# Patient Record
Sex: Male | Born: 1945 | Race: White | Hispanic: No
Health system: Midwestern US, Academic
[De-identification: ages and names within clinical notes are randomized; demographics above are authoritative.]

---

## 2004-05-31 LAB — COMPREHENSIVE METABOLIC PANEL
ALT: 42 units/L (ref 3–45)
AST: 29 units/L (ref 3–35)
Albumin: 4.4 g/dL (ref 3.7–5.2)
Alkaline Phosphatase: 57 units/L (ref 44–160)
Anion Gap: 8 (ref 3–16)
BUN: 18 mg/dL (ref 7–21)
CO2: 26 mmol/L (ref 19–32)
Calcium: 9.4 mg/dL (ref 8.6–10.4)
Chloride: 106 mmol/L (ref 100–110)
Creatinine: 1.1 mg/dL (ref 0.7–1.4)
Glucose: 98 mg/dL (ref 70–105)
Potassium: 4.8 mmol/L (ref 3.5–5.0)
Sodium: 140 mmol/L (ref 136–146)
Total Bilirubin: 0.8 mg/dL (ref 0.2–1.0)
Total Protein: 7.3 g/dL (ref 6.2–8.3)

## 2004-05-31 LAB — DIFFERENTIAL, MANUAL
Basophils Absolute: 0.1 10*3/uL (ref 0.0–0.1)
Basophils Relative: 0.7 % (ref 0.0–1.0)
Eosinophils Absolute: 0.1 10*3/uL (ref 0.0–0.6)
Lymphocytes Absolute: 2.9 10*3/uL (ref 0.6–3.2)
Lymphocytes Relative: 41.9 % (ref 15.0–45.0)
Monocytes Absolute: 0.5 10*3/uL (ref 0.0–1.0)
Monocytes Relative: 7.7 % (ref 0.0–12.0)
Neutrophils Absolute: 3.4 10*3/uL (ref 1.0–8.0)
Neutrophils Relative: 47.8 % (ref 40.0–80.0)

## 2004-05-31 LAB — CBC
Hematocrit: 47.6 % (ref 40.0–52.0)
Hemoglobin: 16.1 g/dL (ref 13.6–18.0)
MCH: 30.7 pg (ref 27.0–34.0)
Platelets: 151 10*3/uL (ref 140–400)
RBC: 5.25 10*6/uL (ref 4.30–6.00)
RDW: 12.9 % (ref 11.5–14.5)
WBC: 7 10*3/uL (ref 4.5–11.0)

## 2004-05-31 LAB — LIPID PANEL
Cholesterol, Total: 178 mg/dL (ref 0–200)
HDL: 39 mg/dL (ref 34–61)
LDL Cholesterol: 89 mg/dL (ref 0–100)
Triglycerides: 251 mg/dL (ref 0–150)

## 2004-05-31 LAB — PSA, TOTAL AND FREE: PSA: 0.38 ng/mL (ref 0.0–4.0)

## 2004-05-31 NOTE — Unmapped (Signed)
Signed by   LinkLogic on 05/31/2004 at 22:04:09  Patient: Larry Pugh  Note: All result statuses are Final unless otherwise noted.    Tests: (1) LIPID PROFILE (FATS)    Order Note: LAV1 SST1    Cholesterol               178 mg/dL                   2-202    Triglyceride         [H]  251 mg/dL                   5-427    HDL                       39 mg/dL                    06-23    LDL, calc                 89 mg/dL                    7-628    Note: An exclamation mark (!) indicates a result that was not dispersed into   the flowsheet.  Document Creation Date: 05/31/2004 10:04 PM  _______________________________________________________________________    (1) Order result status: Final  Collection or observation date-time: 05/31/2004 08:45  Requested date-time: 05/31/2004 08:45  Receipt date-time: 05/31/2004 17:18  Reported date-time: 05/31/2004 22:04  Referring Physician:    Ordering Physician: Fayrene Fearing Myasia Sinatra [facility])  Specimen Source: S&SERUM     SST REFRIG&SST (Refrig)  Source: Butler Denmark Order Number: [id number]  Lab site: [facility]  [id number]

## 2004-05-31 NOTE — Unmapped (Signed)
Signed by   LinkLogic on 05/31/2004 at 18:54:35  Patient: Larry Pugh  Note: All result statuses are Final unless otherwise noted.    Tests: (1) CBC (CBC)    Order Note: LAV1 SST1    WBC                       7.0 10*3/uL                 4.5-11.0    RBC                       5.25 10*6/uL                4.30-6.00    Hgb                       16.1 g/dL                   43.3-29.5    HCT                       47.6 %                      40.0-52.0  ! MCV                       90.7 fL                     81.0-103.0    MCH                       30.7 pg                     27.0-34.0  ! MCHC                      33.8 g/dL                   18.8-41.6    RDW                       12.9 %                      11.5-14.5    Platelet Count            151 10*3/uL                 140-400  ! MPV                  [H]  13.0 fL                     9.5-12.5    Note: An exclamation mark (!) indicates a result that was not dispersed into   the flowsheet.  Document Creation Date: 05/31/2004 6:54 PM  _______________________________________________________________________    (1) Order result status: Final  Collection or observation date-time: 05/31/2004 08:45  Requested date-time: 05/31/2004 08:45  Receipt date-time: 05/31/2004 17:18  Reported date-time: 05/31/2004 18:54  Referring Physician:    Ordering Physician: Fayrene Fearing Keshaun Dubey ([facility])  Specimen Source: WB&WHOLE BLOOD     LV5  REFRIG&LAV  (Refrig)  Source: Butler Denmark Order Number: [id number]  Lab site: [facility], [address]  [place]  [place]  [address]

## 2004-05-31 NOTE — Unmapped (Signed)
Signed by   LinkLogic on 06/02/2004 at 01:17:09  Patient: Larry Pugh  Note: All result statuses are Final unless otherwise noted.    Tests: (1) PSA (PROSTATIC SPECIFIC ANTIGEN) (PSA)    Order Note: Test(s) added on as requested.      Prostate Spec Ag          0.38 ng/mL                  0.0-4.0      If you are ordering a Screening PSA on a Medicare Patient, you must order   test code PSASCR and provide a V76.44 diagnosis code. Medicare covers a   screening PSA once every 12 months for males over 50.    Prostatic Specific Antigen (PSA) is performed by [employer] using a   chemiluminescent immunoassay.  Results obtained with different assay methods   or kits cannot be used interchangeably.  PSA measurement is indicated in   conjunction with Digital Rectal Exam (DRE) as an aid in the detection of   prostatic cancer in men aged 39 or older.  This assay is further indicated as   an aid in the management (monitoring) of patients with prostatic cancer.  PSA   can also be useful for determining possible recurrence after therapy when used   in conjunction with other diagnostic indices.    Note: An exclamation mark (!) indicates a result that was not dispersed into   the flowsheet.  Document Creation Date: 06/02/2004 1:17 AM  _______________________________________________________________________    (1) Order result status: Final  Collection or observation date-time: 05/31/2004 08:45  Requested date-time: 05/31/2004 08:45  Receipt date-time: 06/01/2004 10:15  Reported date-time: 06/02/2004 01:17  Referring Physician:    Ordering Physician: Fayrene Fearing Keighan Amezcua [facility])  Specimen Source: S&SERUM     SST REFRIG&SST (Refrig)  Source: Butler Denmark Order Number: [id number]  Lab site: [facility]  [id number]

## 2004-05-31 NOTE — Unmapped (Signed)
Signed by   LinkLogic on 05/31/2004 at 18:54:36  Patient: Larry Pugh  Note: All result statuses are Final unless otherwise noted.    Tests: (1) DIFFERENTIAL (DIFF)    Order Note: LAV1 SST1    Neutrophil                47.8 %                      40.0-80.0    Lymphocyte                41.9 %                      15.0-45.0    Monocyte                  7.7 %                       0.0-12.0    Eosinophil                1.9 %                       0.0-8.0    Basophils                 0.7 %                       0.0-1.0    ABS NEUT                  3.4 10*3/uL                 1.0-8.0    Abs LYMPH                 2.9 10*3/uL                 0.6-3.2    Abs MONO                  0.5 10*3/uL                 0.0-1.0    Abs EOS                   0.1 10*3/uL                 0.0-0.6    Abs BASO                  0.1 10*3/uL                 0.0-0.1    Note: An exclamation mark (!) indicates a result that was not dispersed into   the flowsheet.  Document Creation Date: 05/31/2004 6:54 PM  _______________________________________________________________________    (1) Order result status: Final  Collection or observation date-time: 05/31/2004 08:45  Requested date-time: 05/31/2004 08:45  Receipt date-time: 05/31/2004 17:18  Reported date-time: 05/31/2004 18:54  Referring Physician:    Ordering Physician: Fayrene Fearing Laterrian Hevener ([facility]JK)  Specimen Source: WB&WHOLE BLOOD     LV5  REFRIG&LAV  (Refrig)  Source: Butler Denmark Order Number: [id number]  Lab site: [facility]  [id number]      -----------------    The following results were not dispersed to the flowsheet  because of errors during the import process:  Eosinophil, 1.9 %, (F)

## 2004-05-31 NOTE — Unmapped (Signed)
Signed by   LinkLogic on 05/31/2004 at 22:04:08  Patient: Larry Pugh  Note: All result statuses are Final unless otherwise noted.    Tests: (1) COMPREHENSIVE METABOLIC PANEL (METAPNL)    Order Note: LAV1 SST1    Sodium                    140 mEq/L                   136-146    Potassium                 4.8 mEq/L                   3.5-5.0    Chloride                  106 mEq/L                   100-110    CO2                       26 mEq/L                    19-32    Anion Gap                 8 mEq/L                     3-16    BUN                       18 mg/dL                    0-27    Creatinine                1.1 mg/dL                   2.5-3.6    Glucose                   98 mg/dL                    64-403    Calcium                   9.4 mg/dL                   4.7-42.5    BILI, Total               0.8 mg/dL                   9.5-6.3    AST (SGOT)                29 U/L                      3-35    ALT (SGPT)                42 U/L                      3-45    Alk Phosphatase           57 U/L                      44-160  Protein, Total            7.3 g/dL                    2.5-9.5    Albumin                   4.4 g/dL                    6.3-8.7  ! GFR MDRD Af Amer          89 See Note      GFR is estimated using Creatinine, age, gender and race. Patient's values   should be interpreted as a trend.      Between 30 and 90 ml/min/1.74m2, clinical correlation is needed.     For additional information:     [email or web address] and [email or web address]    ! GFR MDRD Non Af Amer      73 See Note      GFR is estimated using Creatinine, age, gender and race. Patient's values   should be interpreted as a trend.      Between 30 and 90 ml/min/1.33m2, clinical correlation is needed.     For additional information:     [email or web address] and [email or web address]    ! 1/Creatinine              0.91    Note: An exclamation mark (!) indicates a result that was not dispersed into   the flowsheet.  Document Creation Date: 05/31/2004 10:04  PM  _______________________________________________________________________    (1) Order result status: Final  Collection or observation date-time: 05/31/2004 08:45  Requested date-time: 05/31/2004 08:45  Receipt date-time: 05/31/2004 17:18  Reported date-time: 05/31/2004 22:04  Referring Physician:    Ordering Physician: Fayrene Fearing Viriginia Amendola [facility])  Specimen Source: S&SERUM     SST REFRIG&SST (Refrig)  Source: Butler Denmark Order Number: [id number]  Lab site: [facility]  [id number]

## 2005-04-27 NOTE — Unmapped (Signed)
Signed by   LinkLogic on 04/28/2005 at 00:11:28  Patient: Larry Pugh  Note: All result statuses are Final unless otherwise noted.    Tests: (1)  (MR)    Order Note:                                        [facility]     PATIENT NAME:   Larry, Pugh                     MR #:  [id number]  DATE OF BIRTH:  Nov 22, 1945                         ACCOUNT #:  [id number]  SURGEON:        Denton Ar, M.D.          ROOM #:  SDS  SERVICE:        Orthopedic Surgery                 NURSING UNIT:  JSDS  PRIMARY:        Horton Chin, M.D.             FC:  C  REFERRING:      Orinda Kenner, M.D.             ADMIT DATE:  04/27/2005  DICTATED BY:    Denton Ar, M.D.          SURGERY DATE:  04/27/2005                                                     DISCHARGE DATE:                                    OPERATIVE REPORT     PREOPERATIVE DIAGNOSIS:     1.  Left knee anterior cruciate ligament subacute tear.     POSTOPERATIVE DIAGNOSIS(ES):     1.  Left anterior cruciate ligament tear, complete.  2.  Medial femoral condyle chondromalacia grade III, central third arch with       loose articular flaps.  3.  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     PROCEDURE(S) PERFORMED:     1.  Left knee exam.  2.  Arthroscopy.  3.  Arthroscopic anterior cruciate ligament reconstruction.  4.  Central third patellar tendon autograft.  5.  Chondroplasty and debridement.     SURGEON:  Denton Ar, MD ASS'T:  Babette Relic.     ANESTHESIA:  General.     PREOPERATIVE ANTIBIOTICS:  Ancef 1 gram.     INDICATIONS FOR PROCEDURE:  The patient is 59.  He had an injury sustaining a  fibular head small avulsion fracture combining with an ACL tear.  He had a  prior meniscectomy done about 10 years ago medially.  He had persistent  problems, feelings of instability, his fibular head fracture, which was small  and had healed well on x-ray's.  There was some concern of  posterolateral  component to this injury.  His clinical exam showed some slight asymmetry in  the posterolateral quadrant, but he did not have increased external rotation  in the office nor did he have a posterolateral significant instability or a  reversed pivot.  Because of failed treatment with the ACL he wished to  proceed surgically.  He did lack slight extension on the left side and I  reviewed with him in the office extensively about getting back to full motion  before surgery, but he was instent on pursuing with the surgery being aware  that he may loose some extension.     DETAILS OF OPERATIVE FINDINGS:  The patient's exam showed a positive pivot  shift, no reverse pivot, no real external rotation, increased at 0-90s.  Posterolateral drawer had some minimally asymmetry.  He had no effusion.  Anterior drawer 1+.     Diagnostic arthroscopy of his left knee demonstrated normal trochlea except  for minor grooves.  His patella had some grade III changes, centrally to the  lateral facet.  These required debridement.  The medial and lateral gutters  were clean.  His lateral compartment and normal articular surfaces and  meniscus did not showed his ACL had scarred into the PCL.  There was a large  remnant of the ACL flipped anteriorly.  This was all removed and cleaned out.  PCL intact.  His medial compartment and a prior meniscotomy and meniscus rim  was stable.  His medial and femoral condyle had grade III changes central  third arch, which was moved down.  These were somewhat diffuse and were  involved more laterally in the MSE.  At the completion of the surgery his ACL  was reconstructed, elimination of his Lachman and Pivot shift.  There is no  lateral wall or notch impingement from the graft.     DETAILS OF PROCEDURE:  The patient was brought to the operating room, put to  sleep, and exam was performed.  He was then positioned with standard  pneumatic tourniquet proximally, but was not used during the  surgery.  Wounds  were preinjected in the knee with 0.25% Marcaine with epinephrine.     Standard prep and drape.  Initially the graft was harvested through twin  horizontal incisions, proximal base of the inferior pole of the patella,  distal three fingerbreadths below the medial joint line.  He had a very broad  patellar tendon measuring about 36 mm.  We left 13-mm medially and took a 1  cm of graft.  We took the patella outline the bone plug of the graft, sawed,  removed and passed into the inferior incision, outlined, sawed and removed.  The graft was fashioned in the backtable to 10 and 9 for the femoral and  tibial sides respectively.  They were about 25 length plugs.     The excess bone was placed in the patella defect as a bone graft.  Oversewn  ________ with 0 Vicryl.     The knee was then arthroscoped.  Above findings were noted.  ArthroCare 2590  was used for the chondroplasty as well as for radius.  Following the  chondroplasties the stump of the ACL was removed.  The notchplasty performed  with combination of an incisor blade full radius and 4.0 round bur.  Once  this was conalized and good visualization of the posterior portion of the  lateral side of the notch obtained curette hole was used as a pilot hole in  this region followed by the Protek tibial guide.  The tibial guide was then  used to pass a pin up in front of the PCL.  The pin was then overdrilled with  a 10-mm cannulated reamer.  The debris was removed from the knee.  The Beath  pin was then passed through the tunnel and into the femoral side with the  knee in about 70 of flexion into the small curette pilot hole.  This was  passed out laterally.  The blind tunnel was drilled to 25 with a 9-mm acorn  bit.  Debris was removed.  A curette was used to notch the anterior portion  of the tunnel on the femoral side.     The graft was then pulled into the knee, nice purchase in both tunnels.  The  femoral side was then fixed with a 7 x 20 screw  with nice purchase.  The  graft was cycled through 30 cycles to remove creep from the graft and tibial  side was then fixed with a 9 x 20 screw with nice purchase.     The patient had elimination of his Lachman and Pivot.     The femoral screw was somewhat vertical and an x-ray was obtained on the  table showed nice screw placement.     Wounds were irrigated.  Closure of the deep structures with some 0 Vicryl on  the distal wound and subcutaneous with 2-0 and subcuticular 4-0 runners on  both wounds, small Steri-Strips, gauze and Tegaderm.     He was reexamined again on the posterolateral corner and he had a symmetric  exam at this point.     He was placed in Vantage brace over top of a cryocuff and brought to PACU in  stable satisfactory condition.     He will go through standard outpatient patient ACL protocol.     TOTAL TIME SPENT WITH PATIENT:                                                                   ________________________________________  RSJ/rm                                ____  D:  04/27/2005 13:48                  Denton Ar, M.D.  T:  04/28/2005 00:06  Job #:  [id number]                                       OPERATIVE REPORT                                        COPY                   PAGE    1 of 1    Note: An exclamation mark (!) indicates a result that was not dispersed into   the flowsheet.  Document Creation Date: 04/28/2005 12:11 AM  _______________________________________________________________________    (  1) Order result status: Final  Collection or observation date-time: 04/27/2005 00:00  Requested date-time:   Receipt date-time:   Reported date-time:   Referring Physician: Silvestre Moment  Ordering Physician:  [facility])  Specimen Source:   Source: DBS  Filler Order Number: [id number]  Lab site:

## 2005-09-05 NOTE — Unmapped (Signed)
Signed by Samantha Crimes MD on 09/07/2005 at 11:16:46    NEUROLOGY GENERAL VISIT    HPI General   Chief Complaint: B/L leg twitching for 5 years    History of Present Illness:   This 60 year old right-handed man with a history of diverticulosis and two back operations was seen today because of calf twitching.     The patient said the problem began around five years ago. He has noticed painless twitching in his calves bilaterally and, over the years, his calf muscles have enlarged in size. He is physically active but has not been doing weight training in his legs. He did have right knee surgery recently. He does ride a bicycle. He had two back operations in the 1980s and after those surgeries, he did have leg numbness that was fairly significant. He is not sure how far up his legs the numbness extended. At one point he had sciatic distribution discomfort on the right, but at another point had it on the left. It sounds as if his back has been reasonably stable in recent years. He did have an MRI in 2005 because of the calf twitching. He had a previous back MRI and a neck MRI about 10 years ago. He brought all three studies for review and I saw no major disturbances. I do not have any written reports on his MRIs.     He has no clear history of neuropathy. He has no history of diabetes. There is no family history of muscular dystrophy. He has a sister with diabetes who has neuropathy. He has a brother who had a stroke.      On exam today, the patient had no abnormalities of cranial nerves II-XII. The motor exam of the upper extremities showed normal tone and power. He had normal fine motor and finger-to-nose movements. His lower extremity exam showed excellent power in all groups. He did have hypertrophy of his calf muscles. He also had obvious calf myokymia bilaterally. I saw no fasciculations or abnormal muscle movements elsewhere. His tone was normal in the legs. I did not test his right knee jerk but his left knee  jerk was 2+. Both ankle jerks were intact. Both toes were downgoing. He had intact vibratory sense in his feet. Cold sense was somewhat diminished in the left foot compared to the right. Pinprick was diminished in his feet compared to his thighs. He did not perceive pinprick that well over his fingertips on either hand. His gait was unremarkable.           LEXAPRO 20 MG TABS (ESCITALOPRAM OXALATE) 1 po qd  TRAZODONE HCL 50 MG TABS (TRAZODONE HCL) 1/2 tab po qd      Intake-Neurology   Chief Complaint: B/L leg twitching for 5 years    Vital Signs   Height: 74 inches  Weight: 224 pounds  Pulse rate: 80   BP #1: 116 / 78mm Hg   BMI: 28.86  BSA: 2.28      Allergies  No Known Allergies  New Medication:  LEXAPRO 20 MG TABS (ESCITALOPRAM OXALATE) 1 po qd  TRAZODONE HCL 50 MG TABS (TRAZODONE HCL) 1/2 tab po qd    Intake recorded by: Lurlean Horns  Sep 05, 2005 9:58 AM          Assessment and Plan  New Problems:  Dx of SYMPTOM, ABNORMAL INVOLUNTARY MOVEMENT NEC (ICD-781.0)     Medications   New medications:  LEXAPRO 20 MG TABS -- 1 po qd  TRAZODONE HCL 50 MG TABS -- 1/2 tab po qd    Assessment    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 He does not have fasciculations. The myokymia that he has is often idiopathic but it can be seen after nerve injuries of various kinds or in the presence of progressive neuropathies. He has not had radiation therapy, which is one risk factor for myokymia. I suspect his myokymia is due to unusual nerve-generated electrical activity perhaps related in some way to his back surgeries in the 1980s. He could try Neurontin if he wants to diminish the severity over time, but the myokymia is not in and of itself harmful. The patient says he does not need to take any medication for it, but mainly wanted to see if it was a sign of any more serious problem. I reassured him that it was not. There is no sign here of motor neuron disease. An EMG could be done to document the  anatomic origin of these discharges, but I do not think that would result in any change in therapy.    Plan    The patient will return to see me if his problem worsens or he develops new neurologic symptoms.     Today's Orders   99244 - Compre Mod Complex - Neuro [[id number]]    Disposition/Follow Up:   Return to clinic as needed

## 2005-09-05 NOTE — Unmapped (Signed)
Signed by Samantha Crimes MD on 09/05/2005 at 00:00:00  Neurology      Imported By: Coletta Memos 09/16/2005 11:13:19    _____________________________________________________________________    External Attachment:    Please see [employer] EMR for this document.

## 2005-09-09 NOTE — Unmapped (Signed)
Signed by Lyla Son on 09/09/2005 at 16:42:32                    Sep 05, 2005          Leanora Ivanoff, M.D.  [phone] [place], [place]   [address]    RE: Larry Pugh   DOB:  [birth year]    Dear Dr. Loleta Chance:     This 60 year old right-handed man with a history of diverticulosis and two back operations was seen today because of calf twitching.     The patient said the problem began around five years ago. He has noticed painless twitching in his calves bilaterally and, over the years, his calf muscles have enlarged in size. He is physically active but has not been doing weight training in his legs. He did have right knee surgery recently. He does ride a bicycle. He had two back operations in the 1980s and after those surgeries, he did have leg numbness that was fairly significant. He is not sure how far up his legs the numbness extended. At one point he had sciatic distribution discomfort on the right, but at another point had it on the left. It sounds as if his back has been reasonably stable in recent years. He did have an MRI in 2005 because of the calf twitching. He had a previous back MRI and a neck MRI about 10 years ago. He brought all three studies for review and I saw no major disturbances. I do not have any written reports on his MRIs.     He has no clear history of neuropathy. He has no history of diabetes. There is no family history of muscular dystrophy. He has a sister with diabetes who has neuropathy. He has a brother who had a stroke.      On exam today, the patient had no abnormalities of cranial nerves II-XII. The motor exam of the upper extremities showed normal tone and power. He had normal fine motor and finger-to-nose movements. His lower extremity exam showed excellent power in all groups. He did have hypertrophy of his calf muscles. He also had obvious calf myokymia bilaterally. I saw no fasciculations or abnormal muscle movements elsewhere. His tone was normal in the legs. I did  not test his right knee jerk but his left knee jerk was 2+. Both ankle jerks were intact. Both toes were downgoing. He had intact vibratory sense in his feet. Cold sense was somewhat diminished in the left foot compared to the right. Pinprick was diminished in his feet compared to his thighs. He did not perceive pinprick that well over his fingertips on either hand. His gait was unremarkable.       Impression:  Calf myokymia. Mr. Greis exam does not show any signs of weakness. He does not have fasciculations. The myokymia that he has is often idiopathic but it can be seen after nerve injuries of various kinds or in the presence of progressive neuropathies. He has not had radiation therapy, which is one risk factor for myokymia. I suspect his myokymia is due to unusual nerve-generated electrical activity perhaps related in some way to his back surgeries in the 1980s. He could try Neurontin if he wants to diminish the severity over time, but the myokymia is not in and of itself harmful. The patient says he does not need to take any medication for it, but mainly wanted to see if it was a sign of any more  serious problem. I reassured him that it was not. There is no sign here of motor neuron disease. An EMG could be done to document the anatomic origin of these discharges, but I do not think that would result in any change in therapy.    Plan:  The patient will return to see me if his problem worsens or he develops new neurologic symptoms.     Thank you for allowing me to participate in the care of this patient.  If you would like a copy of my office note, please contact our Medical Records department at [phone].  Please feel free to contact me should you have any concerns or questions.    Sincerely,      Shellia Cleverly, MD    /md

## 2006-04-04 LAB — COMPREHENSIVE METABOLIC PANEL
A/G Ratio: 1.7 (ref 1.0–2.1)
ALT: 57 units/L (ref 9–60)
AST: 49 units/L (ref 10–35)
Albumin: 4.4 g/dL (ref 3.6–5.1)
Alkaline Phosphatase: 59 units/L (ref 40–115)
BUN/Creatinine Ratio: 18 (ref 6–22)
BUN: 21 mg/dL (ref 7–25)
CO2: 26 mmol/L (ref 21–33)
Calcium: 8.8 mg/dL (ref 8.6–10.2)
Chloride: 105 mmol/L (ref 98–110)
Creatinine: 1.2 mg/dL (ref 0.50–1.30)
GFR MDRD Non Af Amer: >60 mL/min (ref >=60)
Globulin, Total: 2.6 g/dL (ref 2.1–3.7)
Glucose: 99 mg/dL (ref 65–99)
Potassium: 4.5 mmol/L (ref 3.5–5.3)
Sodium: 140 mmol/L (ref 135–146)
Total Bilirubin: 0.6 mg/dL (ref 0.2–1.2)
Total Protein: 7 g/dL (ref 6.2–8.3)

## 2006-04-04 LAB — CBC AND DIFFERENTIAL
Basophils Absolute: 55 10*3/uL (ref 0–200)
Basophils Relative: 0.9 %
Eosinophils Absolute: 128 10*3/uL (ref 15–500)
Eosinophils Relative: 2.1 %
Hematocrit: 44.6 % (ref 38.5–50.0)
Hemoglobin: 15.3 g/dL (ref 13.2–17.1)
Lymphocytes Absolute: 2464 10*3/uL (ref 850–3900)
Lymphocytes Relative: 40.4 %
MCH: 31.5 pg (ref 27.0–33.0)
MCHC: 34.3 g/dL (ref 32.0–36.0)
MCV: 91.7 fL (ref 80.0–100.0)
Monocytes Absolute: 671 10*3/uL (ref 200–950)
Monocytes Relative: 11 %
Neutrophils Absolute: 2782 10*3/uL (ref 1500–7800)
Neutrophils Relative: 45.6 %
Platelets: 165 10*3/uL (ref 140–400)
RBC: 4.86 10*6/uL (ref 4.20–5.80)
RDW: 13.8 % (ref 11.0–15.0)
WBC: 6.1 10*3/uL (ref 3.8–10.8)

## 2006-04-04 LAB — PSA, TOTAL AND FREE: PSA: 0.2 ng/mL (ref <=4.0)

## 2006-04-04 LAB — LIPID PANEL
Chol/HDL Ratio: 3.7 (ref <=5.0)
Cholesterol, Total: 155 mg/dL (ref 125–200)
HDL: 42 mg/dL (ref >=40)
LDL Cholesterol: 81 mg/dL (ref <130)
Triglycerides: 158 mg/dL (ref <150)

## 2006-04-04 LAB — IRON STUDIES
% Iron Saturation: 24 % (ref 20–50)
Iron: 79 ug/dL (ref 45–170)
TIBC: 329 ug/dL (ref 250–425)

## 2006-04-04 NOTE — Unmapped (Signed)
Signed by   LinkLogic on 04/05/2006 at 07:14:38  Patient: Larry Pugh  Note: All result statuses are Final unless otherwise noted.    Tests: (1) PSA, TOTAL (VWU-9811)    PSA, TOTAL                0.2 ng/mL                   < OR = 4.0      PSA VALUES FROM DIFFERENT ASSAY METHODS CANNOT BE      USED INTERCHANGEABLY. THIS ASSAY WAS PERFORMED      USING THE BAYER CHEMILUMINESCENT METHOD.    Note: An exclamation mark (!) indicates a result that was not dispersed into   the flowsheet.  Document Creation Date: 04/05/2006 7:14 AM  _______________________________________________________________________    (1) Order result status: Final  Collection or observation date-time: 04/04/2006 09:00  Requested date-time:   Receipt date-time: 04/04/2006 22:59  Reported date-time: 04/05/2006 07:00  Referring Physician:    Ordering Physician: Roxanna Mew [facility])  Specimen Source: S  Source: Lucien Mons Order Number: [id number]  Lab site: [facility] [place]      [address]      [place]  [place]  [address]

## 2006-04-04 NOTE — Unmapped (Signed)
Signed by   LinkLogic on 04/05/2006 at 07:14:37  Patient: Larry Pugh  Note: All result statuses are Final unless otherwise noted.    Tests: (1) CBC (INCLUDES DIFF/PLT) (QDL-6399)   WHITE BLOOD CELL COUNT                              6.1 Thousand/uL             3.8-10.8    RED BLOOD CELL COUNT      4.86 Million/uL             4.20-5.80    HEMOGLOBIN                15.3 g/dL                   18.8-41.6    HEMATOCRIT                44.6 %                      38.5-50.0    MCV                       91.7 fL                     80.0-100.0    MCH                       31.5 pg                     27.0-33.0    MCHC                      34.3 g/dL                   60.6-30.1    RDW                       13.8 %                      11.0-15.0    PLATELET COUNT            165 Thousand/uL             140-400    ABSOLUTE NEUTROPHILS      2782 cells/uL               1500-7800    ABSOLUTE LYMPHOCYTES      2464 cells/uL               [phone]    ABSOLUTE MONOCYTES        671 cells/uL                200-950    ABSOLUTE EOSINOPHILS      128 cells/uL                15-500    ABSOLUTE BASOPHILS        55 cells/uL                 0-200    NEUTROPHILS               45.6 %    LYMPHOCYTES               40.4 %  MONOCYTES                 11.0 %    EOSINOPHILS               2.1 %    BASOPHILS                 0.9 %    Note: An exclamation mark (!) indicates a result that was not dispersed into   the flowsheet.  Document Creation Date: 04/05/2006 7:14 AM  _______________________________________________________________________    (1) Order result status: Final  Collection or observation date-time: 04/04/2006 09:00  Requested date-time:   Receipt date-time: 04/04/2006 22:59  Reported date-time: 04/05/2006 07:00  Referring Physician:    Ordering Physician: Roxanna Mew [facility])  Specimen Source: B  Source: Lucien Mons Order Number: [id number]  Lab site: [facility] [place]      [address]      [place]  [facility]   [address]      -----------------    The following lab values were dispersed to the flowsheet  with no units conversion:      WHITE BLOOD CELL COUNT, 6.1 THOUSAND/UL, (F)  expected units: 10*3/mm3    RED BLOOD CELL COUNT, 4.86 MILLION/UL, (F)  expected units: 10*6/mm3    PLATELET COUNT, 165 THOUSAND/UL, (F)  expected units: 10*3/mm3    ABSOLUTE NEUTROPHILS, 2782 CELLS/UL, (F)  expected units: K/uL    ABSOLUTE LYMPHOCYTES, 2464 CELLS/UL, (F)  expected units: 10*3/mm3    ABSOLUTE MONOCYTES, 671 CELLS/UL, (F)  expected units: 10*3/microliter    ABSOLUTE EOSINOPHILS, 128 CELLS/UL, (F)  expected units: 10*3/mm3    ABSOLUTE BASOPHILS, 55 CELLS/UL, (F)  expected units: K/uL

## 2006-04-04 NOTE — Unmapped (Signed)
Signed by   LinkLogic on 04/05/2006 at 07:14:35  Patient: Larry Pugh  Note: All result statuses are Final unless otherwise noted.    Tests: (1) IRON AND TOTAL IRON BINDING CAPACITY (QDL-7573)    IRON, TOTAL               79 mcg/dL                   16-109   IRON BINDING CAPACITY                              329 mcg/dL                  604-540    % SATURATION (calc)       24 %                        20-50    Note: An exclamation mark (!) indicates a result that was not dispersed into   the flowsheet.  Document Creation Date: 04/05/2006 7:14 AM  _______________________________________________________________________    (1) Order result status: Final  Collection or observation date-time: 04/04/2006 09:00  Requested date-time:   Receipt date-time: 04/04/2006 22:59  Reported date-time: 04/05/2006 07:00  Referring Physician:    Ordering Physician: Roxanna Mew [facility])  Specimen Source: S  Source: Lucien Mons Order Number: [id number]  Lab site: [facility] [place]      [address]      [place]  [place]  [address]

## 2006-04-04 NOTE — Unmapped (Signed)
Signed by   LinkLogic on 04/05/2006 at 07:14:36  Patient: Larry Pugh  Note: All result statuses are Final unless otherwise noted.    Tests: (1) COMPREHENSIVE METABOLIC PANEL W/EGFR (QDL-10231)    GLUCOSE                   99 mg/dL                    16-10                  FASTING REFERENCE INTERVAL    UREA NITROGEN (BUN)       21 mg/dL                    9-60    CREATININE                1.2 mg/dL                   0.50-1.30   EGFR NON-AFR. AMERICAN                              >60 mL/min/1.110m2           > OR = 60  ! EGFR AFRICAN AMERICAN                              >60 mL/min/1.45m2           > OR = 60   BUN/CREATININE RATIO (calc)                              18                          6-22    SODIUM                    140 mmol/L                  135-146    POTASSIUM                 4.5 mmol/L                  3.5-5.3    CHLORIDE                  105 mmol/L                  98-110    CARBON DIOXIDE            26 mmol/L                   21-33    CALCIUM                   8.8 mg/dL                   4.5-40.9    PROTEIN, TOTAL            7.0 g/dL                    8.1-1.9    ALBUMIN                   4.4 g/dL  3.6-5.1    GLOBULIN (calc)           2.6 g/dL                    5.2-8.4   ALBUMIN/GLOBULIN RATIO (calc)                              1.7                         1.0-2.1    BILIRUBIN, TOTAL          0.6 mg/dL                   1.3-2.4    ALKALINE PHOSPHATASE      59 U/L                      40-115    AST                  [H]  49 U/L                      10-35    ALT                       57 U/L                      9-60    Note: An exclamation mark (!) indicates a result that was not dispersed into   the flowsheet.  Document Creation Date: 04/05/2006 7:14 AM  _______________________________________________________________________    (1) Order result status: Final  Collection or observation date-time: 04/04/2006 09:00  Requested date-time:   Receipt date-time: 04/04/2006 22:59  Reported  date-time: 04/05/2006 07:00  Referring Physician:    Ordering Physician: Roxanna Mew [facility])  Specimen Source: S  Source: Lucien Mons Order Number: [id number]  Lab site: [facility] [place]      [address]      [place]  [place]  [address]

## 2006-04-04 NOTE — Unmapped (Signed)
Signed by   LinkLogic on 04/05/2006 at 07:14:34  Patient: Larry Pugh  Note: All result statuses are Final unless otherwise noted.    Tests: (1) LIPID PANEL WITH REFLEX TO DIRECT LDL (ZOX-09604)    TRIGLYCERIDES        [H]  158 mg/dL                   <540    CHOLESTEROL, TOTAL        155 mg/dL                   981-191    HDL CHOLESTEROL           42 mg/dL                    > OR = 40   LDL-CHOLESTEROL (calc)                              81 mg/dL                    <478             DESIRABLE RANGE <100 MG/DL FOR PATIENTS WITH CHD OR      DIABETES AND <70 MG/DL FOR DIABETIC PATIENTS WITH      KNOWN HEART DISEASE.          CHOL/HDLC RATIO (calc)                              3.7                         < OR = 5.0    Note: An exclamation mark (!) indicates a result that was not dispersed into   the flowsheet.  Document Creation Date: 04/05/2006 7:14 AM  _______________________________________________________________________    (1) Order result status: Final  Collection or observation date-time: 04/04/2006 09:00  Requested date-time:   Receipt date-time: 04/04/2006 22:59  Reported date-time: 04/05/2006 07:00  Referring Physician:    Ordering Physician: Roxanna Mew [facility])  Specimen Source: S  Source: Lucien Mons Order Number: [id number]  Lab site: [facility] [place]      [address]      [place]  [place]  [address]

## 2007-10-08 NOTE — Unmapped (Signed)
Signed by Ernest Mallick MA on 10/08/2007 at 18:35:29    Phone Note   Call from Pharmacy    Pharmacy Name: [facility]  Caller: [phone]   Call for: [facility]  Summary of call: Original rx for Lexapro 20 mg ciltalopra 40 mg is preferred, prescription is on hold waiting a response REF# [id number]  Initial call taken by: Rosemarie Beath,  October 08, 2007 3:34 PM      Follow-up for Phone Call   what is the pt savings? between the two  Follow-up by: Leanora Ivanoff MD,  October 08, 2007 3:44 PM    Additional Follow-up for Phone Call   would only save pt 36 dollars a year howeverhis  plan would save 789 dollars a year  Additional Follow-up by: Lillia Mountain MA,  October 08, 2007 4:05 PM    Additional Follow-up for Phone Call   no change as pt is doing very well  Additional Follow-up by: Leanora Ivanoff MD,  October 08, 2007 5:43 PM    Additional Follow-up for Phone Call   left message  Additional Follow-up by: Ernest Mallick MA,  October 08, 2007 6:33 PM

## 2007-11-02 NOTE — Unmapped (Signed)
Signed by Richarda Overlie on 11/02/2007 at 09:07:27      Preload Clinical Lists   Problems:   ASTHMA (ICD-493.90)  SYMPTOM, ABNORMAL INVOLUNTARY MOVEMENT NEC (ICD-781.0)    Medications:   LEXAPRO 20 MG TABS (ESCITALOPRAM OXALATE) 1 po qd  TRAZODONE HCL 50 MG TABS (TRAZODONE HCL) 1/2 tab po qd      Allergies:  No Known Allergies  Past History  Past Medical History:  Asthma  Surgical History:  Appendectomy:*, 1/03 colon resection , Back Surgery: L5-S1, Carpal Tunnel Release: *, Arthroscopic Knee Surgery: Right, ACL Repair: *, Meniscal Repair: Medial, 97 both elbows, left knee scope x2    Family History: Mother - HTN  Father - HTN, heart disease 52  Brother - CVA  Social History: Marital Status: married,   Spouse: Adrienne  Tobacco Usage:prior smoker  Advice worker Quit: 1981,       Preventive Maintenance     Colonoscopy Test Date: 1/06 Colonoscopy: Normal     Coordinating Care Providers   PCP Name: Leanora Ivanoff MD

## 2007-11-02 NOTE — Unmapped (Signed)
Signed by Leanora Ivanoff MD on 11/02/2007 at 00:00:00  Privacy Notice      Imported By: Candise Che 11/05/2007 11:24:54    _____________________________________________________________________    External Attachment:    Please see [employer] EMR for this document.

## 2007-11-02 NOTE — Unmapped (Signed)
Signed by Leanora Ivanoff MD on 11/02/2007 at 00:00:00  Disclosure      Imported By: Candise Che 11/05/2007 11:24:23    _____________________________________________________________________    External Attachment:    Please see [facility] EMR for this document.

## 2007-11-02 NOTE — Unmapped (Signed)
Signed by   LinkLogic on 11/03/2007 at 23:19:50  Patient: Karver Deahl  Note: All result statuses are Final unless otherwise noted.    Tests: (1) Foot > 2 Views - Right* [id number] (FOG2R)    Order Note:     AP, LATERAL AND OBLIQUE X-RAYS RIGHT FOOT: 11/02/07    CLINICAL HISTORY: PAIN, POSSIBLE GOUT    NO BONY DESTRUCTIVE LESIONS OR FRACTURES ARE IDENTIFIED. THERE IS NO DEFINITE   PERIOSTITIS.    **OPINION**    NORMAL RIGHT FOOT.    **ADDENDUM**    APPROXIMATELY 2-4% OF SIGNIFICANT ACUTE FRACTURES CANNOT BE RECOGNIZED ON   INITIAL RADIOGRAPHS.  FOR THIS REASON, CLINICAL CORRELATION IS RECOMMENDED AND   IF SYMPTOMS PERSIST, REPEAT X-RAYS ARE RECOMMENDED IN 3-5 DAYS.  OTHER   MODALITIES SUCH AS TOMOGRAPHY, CT, BONE SCAN AND MRI IMAGES CAN SHOW FRACTURES   WHICH CANNOT BE VISUALIZED ON ROUTINE X-RAYS.                 Order Note: Site[facility]  [id number]  Rad #: [id number]  Unit #: [id number]  Location: [facility]  Account #: [id number]  Req #: [id number]  Order #: [id number]  Primary Insurance: B/C AUTO  Procedure: Foot > 2 Views - Right* [id number]\\.brxam Date/Time: 11/02/07 1536  Admitting Diagnosis: 274.9  Reason for exam: GOUT            Order Note: Dictated by:  Alinda Dooms M.D.  Signed by:    Alinda Dooms M.D.   11/03/07 2245      LG  D:  11/02/07 1554  T:  11/03/07 2215    This document is confidential medical information.  Unauthorized disclosure or   use of this information is prohibited by law.  If you are not the intended   recipient of this document, please advise [place] by calling immediately   [phone].  ! Foot > 2 Views - Right* [id number]                              Result Below...        RESULT: Impression/Conclusion below  (R)    Note: An exclamation mark (!) indicates a result that was not dispersed into   the flowsheet.  Document Creation Date: 11/03/2007 11:19 PM  _______________________________________________________________________    (1) Order result status:  Final  Collection or observation date-time: 11/02/2007 15:36  Requested date-time: 11/03/2007 22:45  Receipt date-time: 11/02/2007 15:36  Reported date-time:   Referring Physician:    Ordering Physician: Lovie Chol ([facility])  Specimen Source:   Source: Damaris Hippo Order Number: [id number] RADIOLOGY  Lab site: [facility]

## 2007-11-02 NOTE — Unmapped (Signed)
Signed by Leanora Ivanoff MD on 11/02/2007 at 12:22:34      Reason for Visit   Chief Complaint: rt foot pain    History from: patient    Allergies  No Known Allergies    Medications   LEXAPRO 20 MG TABS (ESCITALOPRAM OXALATE) 1 po qd  TRAZODONE HCL 50 MG TABS (TRAZODONE HCL) 1/2 tab po qd  ZYRTEC ALLERGY 10 MG TABS (CETIRIZINE HCL) 1/2 tab daily as needed        Vital Signs:   Ht: 74 in.  Wt: 220 lbs.      BMI: 28.35  BSA: 2.26  Wt chg (lbs): -4  Temperature: 97.4  degrees F  oral    Intake recorded by: Sharlee Blew MA on November 02, 2007 11:59 AM        Joint Pain #1   Chief Complaint: rt foot pain  Duration: 1 day(s)  Other: no trauma    Location:   right Pain Location: foot    Severity:   Pain Quality/Pattern: aching, throbbing  Comment: started slowly 1 m ago but got acutely bad yesterday.  needing crutches    Modifying Factors:     Aggravating Factors:   Aggravates: walking        Right Lower Extremity: Abnormal - 1+ swelling at 2nd mt joint.  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)  New Medications:  ZYRTEC ALLERGY 10 MG TABS (CETIRIZINE HCL) 1/2 tab daily as needed  INDOCIN 50 MG CAPS (INDOMETHACIN) one by mouth three times a day as needed      Preventive Maintenance       Coordinating Care Providers   PCP Name: Leanora Ivanoff MD            Prescriptions:  INDOCIN 50 MG CAPS (INDOMETHACIN) one by mouth three times a day as needed  #21 x 1   Entered and Authorized by: Leanora Ivanoff MD   Signed by: Leanora Ivanoff MD on 11/02/2007   Method used: Print then Give to Patient   RxID: [id number]      Assessment and Plan     Problems   Status of Existing Problems:  Assessed GOUT as new - prob gout but a little atypical. 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

## 2007-11-04 NOTE — Unmapped (Signed)
Signed by Sharlee Blew MA on 11/05/2007 at 09:19:02      Results of Imaging Studies:   X-Ray Date: 11/02/07  X-ray: X-Ray results were normal.  X-Ray results show: NORMAL RIGHT FOOT.    Follow-up by: Leanora Ivanoff MD,  November 04, 2007 11:09 AM     Action taken: call patient to inform of results  Follow-up by: Leanora Ivanoff MD,  November 04, 2007 11:09 AM    Additional Follow-up for Test Results:   Action Taken: Phone Call Completed- patient informed of results  Comments: spoke with wife  Additional Follow-up by: Sharlee Blew MA,  November 05, 2007 9:19 AM

## 2008-01-23 NOTE — Unmapped (Signed)
Signed by Leanora Ivanoff MD on 01/23/2008 at 12:10:36      Reason for Visit   Chief Complaint: asthma    History from: patient    Allergies  No Known Allergies    Medications   LEXAPRO 20 MG TABS (ESCITALOPRAM OXALATE) 1 po qd  TRAZODONE HCL 50 MG TABS (TRAZODONE HCL) 1/2 tab po qd  ZYRTEC ALLERGY 10 MG TABS (CETIRIZINE HCL) 1/2 tab daily as needed  ADVAIR DISKUS 100-50 MCG/DOSE MISC (FLUTICASONE-SALMETEROL) 1 inh twice a day        Vital Signs:   Ht: 74 in.  Wt: 220 lbs.  Wt: 8 ozs.      BMI: 28.35  BSA: 2.26  Wt chg (lbs): 0  Temperature: 97.9  degrees F  oral    Intake recorded by: Sharlee Blew MA on January 23, 2008 8:59 AM    History of Present Illness   c/o pain in rt foot on planter aspect of ball of foot.  indocin helped a little but never went away x 5 m.  no swelling or injury        URI/LRI Symptoms:   Chief complaint: cough  Onset: 5-7 days ago    Associated Symptoms:   Complains of: dyspnea, wheezing    Constipation   Onset: gradual  Duration:  4 day(s)   Severity: moderate  Comments: h/o colectomy for diverticulitis    Associated Symptoms:   Denies: abdominal pain, fever  Comments: started BM yesterday and feeling better    HPI Multiple Chronic     Past History  Past Medical History (reviewed - no changes required):  Asthma  Surgical History (reviewed - no changes required):  Appendectomy:*, 1/03 colon resection , Back Surgery: L5-S1, Carpal Tunnel Release: *, Arthroscopic Knee Surgery: Right, ACL Repair: *, Meniscal Repair: Medial, 97 both elbows, left knee scope x2    Family History (reviewed - no changes required): Mother - HTN  Father - HTN, heart disease 43  Brother - CVA  Social History (reviewed - no changes required): Marital Status: married,   Spouse: Adrienne  Tobacco Usage:prior smoker  Advice worker Quit: 1981,         Physical Exam- Detail:   General Appearance: well-developed, well-nourished and in no acute distress.  Ears: No lesions.  Tympanic membranes translucent,  non-bulging.  Canal walls pink, without discharge.  Hearing grossly intact.  Oropharynx: Normal appearance.  No erythema, exudate or mass. No tonsillar swelling.  Oral Cavity: Gums pink, good dentition.  Oral mucosa and tongue without lesions.  Respiratory: Respiration un-labored.  Lung fields clear to auscultation.  No wheezing, rales, rhonchi or pleural rub.  Neck: No thyromegaly.  No nodules, masses or tenderness.  Lymphatic: Areas palpated not enlarged:  cervical, supraclavicular.  Right Lower Extremity: tender between 3-4th MT joints.  no swelling           New Problems:  CONSTIPATION NOS (ICD-564.00)  FOOT PAIN (ICD-729.5)  New Medications:  ADVAIR DISKUS 100-50 MCG/DOSE MISC (FLUTICASONE-SALMETEROL) 1 inh twice a day      Preventive Maintenance       Coordinating Care Providers   PCP Name: Leanora Ivanoff MD            Prescriptions:  ADVAIR DISKUS 100-50 MCG/DOSE MISC (FLUTICASONE-SALMETEROL) 1 inh twice a day  #1 x 5   Entered and Authorized by: Leanora Ivanoff MD   Signed by: Leanora Ivanoff MD on 01/23/2008   Method used: Print  then Give to Patient   RxID: [id number]      Assessment and Plan     Problems   Status of Existing Problems:  Assessed ASTHMA as deteriorated - restart advair - J Mellody Life MD - Signed  Assessed FOOT PAIN as new -  prob neuroma.  to [place] - Leanora Ivanoff MD - Signed  Assessed CONSTIPATION NOS as new - improved past 2 days.  will follow for now - Leanora Ivanoff MD - Signed  New Problems:  Dx of CONSTIPATION NOS (ICD-564.00)  Onset: 01/23/2008  Dx of FOOT PAIN (ICD-729.5)  Onset: 01/23/2008    Medications   New Prescriptions/Refills:  ADVAIR DISKUS 100-50 MCG/DOSE MISC (FLUTICASONE-SALMETEROL) 1 inh twice a day  #1 x 5, 01/23/2008, Leanora Ivanoff MD    Today's Orders   [id number] - Ofc Vst, Est Level IV [id number]  Podiatry Consult [phone]

## 2008-03-14 NOTE — Unmapped (Signed)
Signed by Leanora Ivanoff MD on 03/14/2008 at 00:00:00  Prescription Change Request      Imported By: Darci Current 04/02/2008 09:23:24    _____________________________________________________________________    External Attachment:    Please see [facility] EMR for this document.

## 2008-03-18 NOTE — Unmapped (Signed)
Signed by Leanora Ivanoff MD on 03/18/2008 at 00:00:00  Podiatry - Dr. Honor Loh      Imported By: Darci Current 04/28/2008 12:11:47    _____________________________________________________________________    External Attachment:    Please see [facility] EMR for this document.

## 2008-07-28 NOTE — Unmapped (Signed)
Signed by Nolon Stalls on 07/28/2008 at 16:06:30    PHONE NOTE  Caller's Cell Phone #: [phone]  Caller: patient  Call for: Larry Pugh    Reason for Call: acute illness. ALLERGIES SO BAD HEAD CLOGGED UP, X 1-WK, O-T-C'S NOT HELPING, LABORING TO BREATHE. NKA'S, ALL IN NOSE NO OTHER SXS, HAS TO BREATHE THRU MOUTH NOSE IS SO CLOGGED.      Initial call taken by: Lavina Hamman,  July 28, 2008 2:46 PM      New Medications:  Prescriptions:  FLONASE  SUSP (FLUTICASONE PROPIONATE SUSP) 2 sprays each nostril every morning  #1 x 5   Entered by: Nolon Stalls   Authorized by: Leanora Ivanoff MD   Signed by: Nolon Stalls on 07/28/2008   Method used: Telephoned to ...        RxID: [id number]  FLONASE  SUSP (FLUTICASONE PROPIONATE SUSP) 2 sprays each nostril every morning    FOLLOW UP  flonase ns 2 sp each nostril every morning #1 5 rf.  if no help after a few days come in  Follow-up by:  Leanora Ivanoff MD,  July 28, 2008 3:48 PM    Prescriptions:  Aleda Grana  SUSP (FLUTICASONE PROPIONATE SUSP) 2 sprays each nostril every morning  #1 x 5   Entered by: Nolon Stalls   Authorized by: Leanora Ivanoff MD   Signed by: Nolon Stalls on 07/28/2008   Method used: Telephoned to ...        RxID: [id number]

## 2008-12-31 NOTE — Unmapped (Signed)
Signed by   LinkLogic on 12/31/2008 at 15:31:07  Patient: Larry Pugh  Note: All result statuses are Final unless otherwise noted.    Tests: (1)  (Emergency Room Report)    Order Note:     CHIEF COMPLAINT:  Epigastric and substernal tightness, pressure sensation and shortness of   breath.    HISTORY OF THE PRESENT ILLNESS:  This is a 63 year old male who states that he has a history of recurrent bouts   of  shortness of breath that he believes that he has some chronic lung disease for   which he  has been on Advair before in the past.  The patient states that this morning   he had  developed an epigastric and substernal pressure sensation.  This did not seem   to radiate  up into his neck, jaw, shoulders, arms or into his back.  He is not   complaining of  abdominal pain or nausea or vomiting.  He denies any palpitation or   lightheadedness.  The  patient rated his discomfort at this time as a 2 on a 0/10 scale and he denies   any other  timing, context or modifying factors or other associated signs or symptoms.    ROS:  Constitutional, skin, ophthalmic, ENT, respiratory, cardiovascular,   gastrointestinal,  genitourinary, musculoskeletal, neurological, psychiatric, endocrine,   hematological and  immunologic systems were reviewed and negative except as noted in HPI.    PAST MEDICAL HISTORY:  The patient is known to be allergic to ADHESIVE TAPE but no other substances.    The  patient does have a history of multiple environmental allergies, a history of   anxiety  neurosis and a history of diverticulitis but no history of coronary artery   syndrome or  any hypertension, hypercholesterolemia, type 2 diabetes.    SURGICAL HISTORY:  He has had a previous partial colectomy due to diverticulitis in 2002.  He has   had  previous right knee surgery.    SOCIAL HISTORY:  The patient denies tobacco, alcohol or other substance use.    FAMILY HISTORY:  Not contributory to the history of the present illness.    Physical exam:  Triage notes, nurse's notes and the main ED chart were reviewed   by EMD.  Vital signs noted by EMD. Blood pressure 121/91, pulse 87, respirations 18,   the pulse  oximetry was 95% saturation on room air, temperature is 98.30F, that was taken   orally.  Appearance: Patient appears well developed, well nourished with a normal body   habitus.    HEENT:  Eyes: PERRL. Full EOMs. No injection or discharge of conjunctivae.   Fundi revealed  sharp disc margins, no papilledema. Sinuses: There is no evidence of swelling,   redness or  tenderness over the maxillary, ethmoid or frontal sinuses. Ears: Pinnae   normal. Both  external auditory canals clear without redness or discharge. Both tympanic   membranes were  normal. Nose: No discharge or drainage. Mouth & throat: No lesions seen on the   lips or  labial/buccal mucosa. No oral lesions or masses seen. No abnormal redness of   the soft  palate, palatine tonsils or oropharynx. No exudate. No abnormal swelling or   bulging seen.  Patient is able to swallow and handle secretions well. Exchanges air well   without stridor.    Neck: No tenderness. No swelling seen. No masses. Normal thyroid. No palpable  lymphadenopathy. Equal carotid pulse waves without any bruits  heard. Neck is   supple.    Chest: Full symmetrical respiratory excursions. No retractions. No use of   accessory  muscles of breathing. No deformity seen. No tenderness on palpation. Normal   sounds on  percussion.    Lungs: Auscultation in all fields reveals; no rales, no rhonchi, no wheezes   and no  pleural friction rubs heard. No transmitted upper airway noises heard.   Auscultation  limited by background noise in ED.    Heart: Normal, regular rhythm. Normal S1 and S2 with split. No S3, S4,   murmurs, clicks or  rubs heard. Normal cardiac chest wall movements noted. No JVD or HJR.    Abdomen: Normal contour visualized. Normal bowel sounds. No tenderness on   palpation. No  rebound or guarding. No flank tenderness.  No hepatomegaly. No splenomegaly. No   palpable  masses. No pulsatile masses or bruits heard. No palpable enlargement of   abdominal aorta.    Musculoskeletal: Normal color and warmth of the extremities. No pitting edema   noted in  the upper or lower extremities. No external evidence of trauma. No tenderness   on  palpation. Distal neurovascular function intact in all fields. Equal and   symmetrical  arterial pulses bilaterally. Joints have full active range of motion without   pain,  redness or swelling. Capillary refill normal.    In the emergency department the 12-lead ECG revealed a sinus rhythm, no   ectopic beats, a  left axis, no ST-T changes and no blocks.  The chest x-ray revealed no acute   disease.  CBC revealed a normal white count of 6.7 with 48% segs, hemoglobin of 15.3,   platelet  count of 187.  The total CPK was elevated to 744 but the MB-CPK was 13.1 with   an index of  2.  Cardiac markers revealed a troponin of 0.03, an INR was normal at 1.0 and   the basis  metabolic panel significant for mild hyperglycemia with a glucose of 125.  In   the  emergency department the patient was felt to have chest pain of unknown   etiology.  It was  not reproduced while on palpation.  No evidence at this time of congestive   heart failure.  No evidence of aortic aneurysm or dissection.  No evidence of thromboembolic   disease.  I  notified Dr. Ricka Burdock who was admitting for the [facility] group that admits for Dr. Leanora Ivanoff  and the patient was stable on transfer to [facility].    CLINICAL IMPRESSION:  Chest pain, unknown etiology.          Order Note: Site: [facility]  [phone]  Admission Date/Time: 12/31/08   Discharge Date/Time: //   Unit #: [id number]  Location: [facility]  Account #: [id number]  Primary Insurance: Rachael Fee ACCESS PPO  Admitting Diagnosis: BREATHING DIFFICULTIES  Report #: [phone]      Order Note:     Order Note:  _________________________________  Signed by:    Laymond Purser MD                        MB  D:  12/31/08 1434  T:  12/31/08 1447    This document is confidential medical information.  Unauthorized disclosure or   use of this information is prohibited by law.  If you are not the intended   recipient of this document, please advise [place]  by calling immediately   [phone].    Note: An exclamation mark (!) indicates a result that was not dispersed into   the flowsheet.  Document Creation Date: 12/31/2008 3:31 PM  _______________________________________________________________________    (1) Order result status: Final  Collection or observation date-time: 12/31/2008 14:34  Requested date-time: 12/31/2008 14:47  Receipt date-time:   Reported date-time:   Referring Physician:    Ordering Physician:  Reviewed In Hospital [facility])  Specimen Source:   Source: Francesco Sor Order Number: [id number] TRANSCRIPTION  Lab site: [facility]

## 2008-12-31 NOTE — Unmapped (Signed)
Signed by   LinkLogic on 01/01/2009 at 01:33:36  Patient: Larry Pugh  Note: All result statuses are Final unless otherwise noted.    Tests: (1)  (History and Physical Report)    Order Note:     CHIEF COMPLAINT:   Shortness of breath, chest pain.    HISTORY OF PRESENT ILLNESS:   Mr. Larry Pugh is a 63 year old Caucasian male.  Past   medical  history is significant for anxiety and previous chest pain with negative   cardiac  catheterization in 2001, who presented to the emergency room with complaint of   chest pain  and shortness of breath.  Patient states he was diagnosed with some muscle   chest pain a  few years ago and has on-and-off chest discomfort, but it was not related to   exertion.  This morning the patient had an episode of chest pressure in the   mid-epigastric area  associated with shortness of breath.  Denies any nausea or vomiting.  No   radiation of the  pain to jaw, shoulder or back.  He complained of some pressure-like pain with   shortness  of breath.  The severity of pain was less than 3-4.  No report of any cough,   fever or  chills.  He states when they started him on oxygen, he felt better.  His O2   saturation at  [facility] was normal.    PAST MEDICAL HISTORY:  1.  History of chest pain with normal cardiac enzymes and cardiac   catheterization a few  years ago.  2.  Anxiety / depression.  3.  History of contact with chlorine and chemicals due to his job.    SOCIAL HISTORY:   Patient denies any tobacco or alcohol.  He works with   chemicals for  swimming pools including chlorine.  Denies any alcohol or recreational drugs.    FAMILY HISTORY:   Father died at age 68 with congestive heart failure.  His   first heart  attack was in his 103s.  Mother died at age 60.  No history of diabetes in the   family.  No  premature coronary artery disease in the family.    ALLERGIES:   TO ADHESIVE TAPE.    MEDICINES AT HOME:  1.  Advair Diskus q.i.d.  2.  Itraconazole daily.  3.  Lexapro.  4.   Trazodone.    PHYSICAL EXAMINATION:   Patient is alert, oriented in no acute distress.    Blood pressure  121/91, respiratory rate 20, pulse 87, O2 saturation 95%.    HEENT:   Head is atraumatic, normocephalic.  Pupils equal, round, reactive to   light and  accommodation.  Extraocular muscles are intact.  Sclerae are anicteric.    Mucous membranes  are moist.    NECK:   Supple.  No JVD, no carotid bruit.    LUNGS:   Clear to auscultation anteriorly and posteriorly.  No crackles, no   wheezing.    CARDIOVASCULAR:   Regular rate and rhythm.  No murmur, gallop or rub.    ABDOMEN:   Soft, nontender, nondistended.    EXTREMITIES:   No cyanosis, no edema.    LABS/TESTS:   The first set of cardiac enzymes was negative.  Chest x-ray did   not show  any acute infiltrate or pneumonia.  BNP was 24.  Sodium was 137, potassium   3.8, BUN 16,  creatinine 1.03.  CBC shows normal white blood  cells with normal hemoglobin   and  hematocrit.  Platelet count was 186,000.    PROBLEM LIST:   Chest pain, shortness of breath.  Patient needs to be ruled   out for acute  coronary syndrome.  Also, will obtain a CT scan of the chest to rule out   pulmonary  embolism.  Will admit patient to Telemetry.  Obtain 2 more sets of cardiac   enzymes.  Patient started on aspirin, Nexium for GI prophylaxis, and follow with cardiac   enzymes  and CT scan of the chest results.  If cardiac enzymes and CT report are all   negative, may  schedule for stress Myoview in the morning.          Order Note: Site: [facility]  [phone]  Admission Date/Time: 12/31/08   Discharge Date/Time: //   Unit #: [id number]  Location: [facility]  Account #: [id number]  Primary Insurance: Rachael Fee ACCESS PPO  Admitting Diagnosis: CHEST PAIN,MIDSTERNAL,SUBSTERNAL,PRECORDIAL  Report #: [id number]      Order Note:     Order Note: _________________________________  Signed by:    Edwina Barth MD                        PG  D:  12/31/08 1810  T:   01/01/09 0048    This document is confidential medical information.  Unauthorized disclosure or   use of this information is prohibited by law.  If you are not the intended   recipient of this document, please advise [place] by calling immediately   [phone].    Note: An exclamation mark (!) indicates a result that was not dispersed into   the flowsheet.  Document Creation Date: 01/01/2009 1:33 AM  _______________________________________________________________________    (1) Order result status: Final  Collection or observation date-time: 12/31/2008 18:10  Requested date-time: 01/01/2009 00:48  Receipt date-time:   Reported date-time:   Referring Physician:    Ordering Physician:  Reviewed In Hospital [facility])  Specimen Source:   Source: Francesco Sor Order Number: [id number] TRANSCRIPTION  Lab site: [facility]

## 2009-01-01 NOTE — Unmapped (Signed)
Signed by   LinkLogic on 01/01/2009 at 16:01:39  Patient: Larry Pugh  Note: All result statuses are Final unless otherwise noted.    Tests: (1)  (D/C Medication Reconciliation)    Order Note:     FLUTICASONE PROPIONATE 0.05         Take 1 SPRAY EACH NOSTRIL    (FLONASE 0.05% NASAL SPRAY )    EVERY DAY By NASAL    Next Dose At:_____________    ESCITALOPRAM OXALATE                Take 5 MG    (LEXAPRO )                      DAILY AT 2100 By Mouth    Next Dose At:_____________    TRAZODONE HCL                       Take 25 MG    (DESYREL )                      AT BEDTIME DAILY By Mouth    Next Dose At:_____________      New Orders:  FLUTICASONE/ SALMETEROL 250/50 ONE PUFF TWICE DAILY  ALBUTEROL INH PRN  SEE NEW SCRIPTS FOR ADVAIR 250/50 ONE PUFF TWICE DAILY  ALBUTEROL INH TWO PUFFS EVERY SIX (6) HRS AS NEEDED    A copy of this report was provided to patient: [name], [name]            Order Note: Site: [facility]  [phone]  Admission Date/Time: 12/31/08   Discharge Date/Time: //   Unit #: [id number]  Location: [facility]  Account #: [id number]  Primary Insurance: Rachael Fee ACCESS PPO  Admitting Diagnosis: CHEST PAIN,MIDSTERNAL,SUBSTERNAL,PRECORDIAL  Report #: [id number]      Order Note:     Order Note: _________________________________  Signed by:                              DC  D:  01/01/09 1513  T:  01/01/09 1513    This document is confidential medical information.  Unauthorized disclosure or   use of this information is prohibited by law.  If you are not the intended   recipient of this document, please advise [place] by calling immediately   [phone].    Note: An exclamation mark (!) indicates a result that was not dispersed into   the flowsheet.  Document Creation Date: 01/01/2009 4:01 PM  _______________________________________________________________________    (1) Order result status: Final  Collection or observation date-time: 01/01/2009 15:13  Requested date-time: 01/01/2009  15:13  Receipt date-time:   Reported date-time:   Referring Physician:    Ordering Physician:  Reviewed In Hospital [facility])  Specimen Source:   Source: Francesco Sor Order Number: [id number] TRANSCRIPTION  Lab site: [facility]

## 2009-01-01 NOTE — Unmapped (Signed)
Signed by   LinkLogic on 01/01/2009 at 12:33:32  Patient: Larry Pugh  Note: All result statuses are Final unless otherwise noted.    Tests: (1) Stress Card/Pharm Card Gen Ord [id number] ... more (SC)    Order Note:     Order Note: Site[facility]  Unit #: [id number]  Location: [facility]  Account #: [id number]  Req #: [id number]; [id number]  Order #: [id number]; [id number]  Procedure: Stress Card/Pharm Card Royston Cowper [id number]; [phone] Pharmacologic   StressTest [id number]  Exam Date/Time: 01/01/09 0001  Admitting Diagnosis: CHEST PAIN,MIDSTERNAL,SUBSTERNAL,PRECORDIAL  Reason for exam:           Order Note:     Order Note:         INDICATIONS:  Chest pain.        RESTING EKG:     ARRHYTHMIAS:        None.            QRS COMPLEXES:      Normal.            ST-T WAVES:         Normal.        PHARMACOLOGIC:  Under continuous ECG/BP monitoring, the patient received   Adenosine according to Standard Protocol.        The study was terminated due to completion of protocol.        EKG following stress:     ARRHYTHMIAS:        No change.            QRS COMPLEXES:      No change.            ST-T WAVES:        No change.        IMPRESSION:     I.      Negative by clinical and ECG criteria.        II.     Normal BP response to stress.        III.     ARRHYTHMIAS:     None.        V.     Nuclear report to be provided under separate cover.                 Dictated by:  Vanessa Durham MD                                        D:  01/01/09   T:  01/01/09 1156   DE    This document is confidential medical information.  Unauthorized disclosure or   use of this information is prohibited by law.  If you are not the intended   recipient of this document, please advise [place] by calling immediately   [phone].    Note: An exclamation mark (!) indicates a result that was not dispersed into   the flowsheet.  Document Creation Date: 01/01/2009 12:33  PM  _______________________________________________________________________    (1) Order result status: Final  Collection or observation date-time: 01/01/2009  Requested date-time: 01/01/2009 11:56  Receipt date-time: 01/01/2009 00:01  Reported date-time:   Referring Physician:    Ordering Physician:  Reviewed In Hospital [facility])  Specimen Source:   Source: Francesco Sor Order Number: [id number] TRANSCRIPTION  Lab site: [facility]

## 2009-01-01 NOTE — Unmapped (Signed)
Signed by   LinkLogic on 01/02/2009 at 04:20:44  Patient: Antrone Sinkfield  Note: All result statuses are Final unless otherwise noted.    Tests: (1)  (DS)    Order Note:   For your records, the bill status(es) for Chaitanya B Riker during their hospital   stay is as follows:    12/31/08 1206  ENTER REG ER  12/31/08 1523  CHANGE IN TO OBSERV  12/31/08 1523  ENTER ADM IN  01/01/09 1758  DISCHARGE OBSERV    Any questions should be directed to the Care Management Departments:  [facility] [phone] or [facility] [phone].          Order Note: Site: [facility]  [phone]  Admission Date/Time: 12/31/08   Discharge Date/Time: 01/01/09   Unit #: [id number]  Location: [facility]  Account #: [id number]  Primary Insurance: Rachael Fee ACCESS PPO  Admitting Diagnosis: CHEST PAIN,MIDSTERNAL,SUBSTERNAL,PRECORDIAL  Report #: [id number]      Order Note:     Order Note: _________________________________  Signed byBonnetta Barry  D:  01/01/09 1758  T:  01/01/09 1758    This document is confidential medical information.  Unauthorized disclosure or   use of this information is prohibited by law.  If you are not the intended   recipient of this document, please advise [place] by calling immediately   [phone].    Note: An exclamation mark (!) indicates a result that was not dispersed into   the flowsheet.  Document Creation Date: 01/02/2009 4:20 AM  _______________________________________________________________________    (1) Order result status: Final  Collection or observation date-time: 01/01/2009 17:58  Requested date-time: 01/01/2009 17:58  Receipt date-time:   Reported date-time:   Referring Physician:    Ordering Physician:  Reviewed In Hospital [facility])  Specimen Source:   Source: Francesco Sor Order Number: [id number] TRANSCRIPTION  Lab site: [facility]

## 2009-01-01 NOTE — Unmapped (Signed)
Signed by   LinkLogic on 01/05/2009 at 06:32:02  Patient: Larry Pugh  Note: All result statuses are Final unless otherwise noted.    Tests: (1)  (Discharge Summary Report)    Order Note:         FAMILY DOCTOR:  Leanora Ivanoff, M.D.    IDENTIFICATION:  A 63 year old very pleasant gentleman.    DISCHARGE DIAGNOSES:  1.  Chest pain, atypical more than likely this is secondary to bronchospasm.  2.  The patient has history of asthma.    INVESTIGATIONS:  Adenosine Myoview stress test, which was reported normal.    The ejection fraction was normal.  No ischemic changes.  He had a CT scan of   the chest, which showed no PE from atelectasis possible.    DISCHARGE MEDICATIONS:  I gave him a prescription for:  1.  Advair 250/50 one puff twice a day.  2.  Proventil p.r.n.  3.  He will continue his Lexapro.  4.  Desyrel.    HOSPITAL COURSE:  This is a 63 year old very pleasant gentleman who came in   with some chest tightness, mid epigastric pain kind of sensation.  The patient   has history of asthma.  He does use Advair.  He felt like he needed some   breathing treatment.  He said after the breathing treatment, his chest   pressure sensation was much better, but however, since the patient is a   63 year old he had a history of cardiac attack 10 years ago also the patient   underwent a stress test, which was completely normal.  He also has some   anxiety component.  He is already on medicine for that.  I do not think this   is anything related with gallbladder, but if this continuous, he should   probably need to get a gallbladder ultrasound.  He felt completely fine.    There was no chest pain, no shortness of breath. As mentioned, he did   apparently got some breathing treatment.  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  The patient   apparently did state he fell like he was more short of breath that anything   else.  CT scan was normal and stress test was normal  therefore the patient   will be discharged to home and follow up with Dr. Loleta Chance.                   Order Note: Site: [facility]  [phone]  Admission Date/Time: 12/31/08   Discharge Date/Time: 01/01/09   Unit #: [id number]  Location: [facility]4T1  Account #: [id number]  Primary Insurance: Rachael Fee ACCESS PPO  Admitting Diagnosis: CHEST PAIN,MIDSTERNAL,SUBSTERNAL,PRECORDIAL  Report #: [id number]      Order Note:     Order Note: _________________________________  Signed by:    Georganna Skeans MD                        RN  D:  01/01/09   T:  01/05/09 0509    This document is confidential medical information.  Unauthorized disclosure or   use of this information is prohibited by law.  If you are not the intended   recipient of this document, please advise [place] by calling immediately   [phone].    Note: An exclamation mark (!) indicates a result that was not dispersed  into   the flowsheet.  Document Creation Date: 01/05/2009 6:32 AM  _______________________________________________________________________    (1) Order result status: Final  Collection or observation date-time: 01/01/2009  Requested date-time: 01/05/2009 05:09  Receipt date-time:   Reported date-time:   Referring Physician:    Ordering Physician:  Reviewed In Hospital [facility])  Specimen Source:   Source: Francesco Sor Order Number: [id number] TRANSCRIPTION  Lab site: [facility]

## 2009-01-05 NOTE — Unmapped (Signed)
Signed by Leanora Ivanoff MD on 01/05/2009 at 09:51:01      Reason for Visit   Chief Complaint: follow hospital, sob, chest discomfort    History from: patient    Allergies  No Known Allergies    Medications   LEXAPRO 20 MG TABS (ESCITALOPRAM OXALATE) 1 po qd  TRAZODONE HCL 50 MG TABS (TRAZODONE HCL) 1/2 tab po qd  ADVAIR DISKUS 100-50 MCG/DOSE MISC (FLUTICASONE-SALMETEROL) 1 inh twice a day  FLONASE  SUSP (FLUTICASONE PROPIONATE SUSP) 2 sprays each nostril every morning        Vital Signs:   Wt: 223 lbs.      BMI: 28.73  BSA: 2.28  Wt chg (lbs): 3  Temperature: 98.0  degrees F  oral  BP: 122/80    Intake recorded by: Ernest Mallick MA on January 05, 2009 9:18 AM    History of Present Illness   f/u hosp for asthma exac and CP - stress test was normal .  still having dyspnea  -given a higher dose of advair but did not fill yet.  worse with exertion.  Has appt with Arlyce Harman next week.  no f/c. cough - dry          Physical Examination:   BP: 122/  80    Physical Exam- Detail:   General Appearance: well-developed, well-nourished and in no acute distress.  Respiratory: exp wheeze with forced expiration           New Medications:  PROVENTIL HFA 120 MCG/ACT AERO SOLN (ALBUTEROL SULFATE) two puffs every four hours as needed      Preventive Maintenance       Coordinating Care Providers   PCP Name: Leanora Ivanoff MD            Prescriptions:  PROVENTIL HFA 120 MCG/ACT AERO SOLN (ALBUTEROL SULFATE) two puffs every four hours as needed  #1 x 5   Entered and Authorized by: Leanora Ivanoff MD   Signed by: Leanora Ivanoff MD on 01/05/2009   Method used: Print then Give to Patient   RxID: [id number]  TRAZODONE HCL 50 MG TABS (TRAZODONE HCL) 1/2 tab po qd  #15 x 5   Entered and Authorized by: Leanora Ivanoff MD   Signed by: Leanora Ivanoff MD on 01/05/2009   Method used: Print then Give to Patient   RxID: [id number]  LEXAPRO 20 MG TABS (ESCITALOPRAM OXALATE) 1 po qd  #30 x 5   Entered and Authorized by: Leanora Ivanoff MD   Signed  by: Leanora Ivanoff MD on 01/05/2009   Method used: Print then Give to Patient   RxID: [id number]      Assessment and Plan     Problems   Status of Existing Problems:  Assessed ASTHMA as deteriorated - change to symbicort 160.  f/u with Katrine Coho MD    Medications   New Prescriptions/Refills:  PROVENTIL HFA 120 MCG/ACT AERO SOLN (ALBUTEROL SULFATE) two puffs every four hours as needed  #1 x 5, 01/05/2009, Leanora Ivanoff MD  TRAZODONE HCL 50 MG TABS (TRAZODONE HCL) 1/2 tab po qd  #15 x 5, 01/05/2009, J Korey Malique Driskill MD  LEXAPRO 20 MG TABS (ESCITALOPRAM OXALATE) 1 po qd  #30 x 5, 01/05/2009, Leanora Ivanoff MD    Today's Orders   [phone] - Ofc Vst, Est Level III [[id number]]

## 2009-03-23 NOTE — Unmapped (Signed)
Signed by Leanora Ivanoff MD on 03/23/2009 at 00:00:00  Pulmonary - Dr. Fenton Malling      Imported By: Darci Current 03/30/2009 08:40:54    _____________________________________________________________________    External Attachment:    Please see [facility] EMR for this document.

## 2009-05-13 NOTE — Unmapped (Signed)
Signed by Ernest Mallick MA on 05/14/2009 at 15:03:01    PHONE NOTE  Caller's Cell Phone #: [phone]  Caller: patient  Call for: DR Violett Hobbs    Reason for Call: NEEDS REFERRAL TO ORTHO DR...HAVING HIP PROBLEMS      Initial call taken by: Nolon Stalls,  May 13, 2009 1:28 PM      FOLLOW UP  Muccio  Follow-up by:  Leanora Ivanoff MD,  May 14, 2009 7:01 AM    FOLLOW UP  patient advised  Follow-up by:  Ernest Mallick MA,  May 14, 2009 3:03 PM

## 2009-05-27 NOTE — Unmapped (Signed)
Signed by   [employer]Log[employer] on 05/27/2009 at 16:24:35  Patient: Larry Pugh  Note: All result statuses are Final unless otherwise noted.    Tests: (1) Hip-Bilateral [id number] (HPB)    Order Note:     TWO VIEWS BILATERAL HIPS 05/26/09:    No comparison.    CLINICAL HISTORY:   Bilateral hip pain.    FINDINGS:    Both hips are located. Minimal spurring is seen associated with the bilateral   hip joints. No fracture or other focal bony abnormality is seen. There are   anastomotic bowel sutures in the left side of the pelvis. Soft tissue   structures are otherwise unremarkable. Bony structures appear unremarkable.    IMPRESSION:    Mild bilateral hip osteoarthritis.              Order Note: Non-EMR Ordering Provider: Carylon Perches DO    Order Note: Site: [facility]  [phone]  Rad #: [id number]  Unit #: [id number]  Location: [facility]  Account #: [id number]  Req #: [id number]  Order #: [id number]  Primary Insurance: [employer]ach[employer] F[employer] ACCESS PPO  Procedure: Hip-Bilateral [id number]\\.brxam Date/Time: 05/26/09 1507  Admitting Diagnosis: BILATERAL HIP PAIN  Reason for exam: HIP PAIN            Order Note:     Order Note: Dictated by:  Quita Skye. Hankin MD  Signed by:    Quita Skye. Hankin MD   05/27/09 1557      [place]  D:  05/26/09 1552  T:  05/27/09 1342    This document is confidential medical information.  Unauthorized disclosure or   use of this information is prohibited by law.  If you are not the intended   recipient of this document, please advise [place] by calling immediately   [phone].  ! Hip-Bilateral [id number]                              Result Below...        RESULT: Impression/Conclusion below  (R)    Note: An exclamation mark (!) indicates a result that was not dispersed into   the flowsheet.  Document Creation Date: 05/27/2009 4:24 PM  _______________________________________________________________________    (1) Order result status: Final  Collection or observation date-time:   Requested date-time: 05/27/2009  15:57  Receipt date-time: 05/26/2009 15:07  Reported date-time:   Referring Physician:    Ordering Physician:  Non-EMR Physician [facility])  Specimen Source:   Source: Damaris Hippo Order Number: [id number] RADIOLOGY  Lab site: [facility]

## 2009-06-15 NOTE — Unmapped (Signed)
Signed by   LinkLogic on 06/15/2009 at 09:53:02  Patient: Larry Pugh  Note: All result statuses are Final unless otherwise noted.    Tests: (1) [address]1250 Chest/Lung wo Cont [id number] [facility])    Order Note:     HISTORY: Lung nodule.  \\.brXAMINATION: CT SCAN OF THE CHEST WITHOUT IV CONTRAST.  DATE OF EXAM: 06/15/09.  COMPARISON: 12/31/08.    Overlapping 5 mm cuts are obtained from the base of the neck through the   liver. Images are evaluated with lung and mediastinal windows.    The trachea and both mainstem bronchi are clear. The extremely tiny subpleural   nodule is again seen posterior right upper lobe, series 3, image 26. This   measures 2.0 mm. There is some subpleural nodule like atelectasis superior   segment right lower lobe similar to September 15. The basilar subpleural   atelectasis in the posterior right base has shown some improved aeration   compared to September 15 with some residual atelectasis or scarring. No   pleural effusions are seen. No abnormal sized lymph nodes are seen. The upper   abdominal structures are unremarkable.   IMPRESSION:  \\.brxtremely tiny 2 mm noncalcified lung nodules subpleural area, posterior   right upper lobe, unchanged. If the patient is a nonsmoker, consider one year   follow up.   Improved aeration in areas of subpleural nodular atelectasis right lower lobe.                Order Note: Non-EMR Ordering Provider: Winfred Leeds MD    Order Note: Site[facility]  [phone]  Rad #: [id number]  Unit #: [id number]  Location: [facility]  Account #: [id number]  Req #: [id number]  Order #: [id number]  Primary Insurance: Rachael Fee ACCESS PPO  Procedure: 71250 Chest/Lung wo Cont [id number]\\.brxam Date/Time: 06/15/09 0800  Admitting Diagnosis: LUNG NODULE 239.1  Reason for exam: LUNG NOD            Order Note:     Order Note: Dictated by:  Franchot Erichsen MD  Signed by:    Franchot Erichsen MD   06/15/09 7425      [place]  D:  06/15/09 0840  T:  06/15/09 0921    This document is  confidential medical information.  Unauthorized disclosure or   use of this information is prohibited by law.  If you are not the intended   recipient of this document, please advise [place] by calling immediately   [phone].  ! 71250 Chest/Lung wo Cont [id number]                              Result Below...        RESULT: Impression/Conclusion below  (R)    Note: An exclamation mark (!) indicates a result that was not dispersed into   the flowsheet.  Document Creation Date: 06/15/2009 9:53 AM  _______________________________________________________________________    (1) Order result status: Final  Collection or observation date-time:   Requested date-time: 06/15/2009 09:34  Receipt date-time: 06/15/2009 08:00  Reported date-time:   Referring Physician:    Ordering Physician:  Non-EMR Physician [facility])  Specimen Source:   Source: Damaris Hippo Order Number: [id number] RADIOLOGY  Lab site: [facility]

## 2009-07-22 NOTE — Unmapped (Signed)
Signed by Doree Fudge MD on 07/22/2009 at 14:18:38      Reason for Visit   Chief Complaint: sore throat (4)    History from: patient    Allergies  No Known Allergies    Medications   LEXAPRO 20 MG TABS (ESCITALOPRAM OXALATE) 1 po qd  TRAZODONE HCL 50 MG TABS (TRAZODONE HCL) 1/2 tab po qd        Vital Signs:   Ht: 74 in.  Wt: 227 lbs.      BMI: 29.25  BSA: 2.30  Wt chg (lbs): 4  Temperature: 98.2  degrees F  oral    Intake recorded by: Sharlee Blew MA on July 22, 2009 1:50 PM        URI/LRI Symptoms:   Chief complaint: cough, sore throat  Onset: 1 week ago  Quality- cough: productive, purulent sputum    Associated Symptoms:   Complains of: fever, nasal congestion          Physical Exam- Detail:   General Appearance: well-developed, well-nourished and in no acute distress.  Ears: No lesions.  Tympanic membranes translucent, non-bulging.  Canal walls pink, without discharge.  Hearing grossly intact.  Oropharynx: Normal appearance.  No erythema, exudate or mass. No tonsillar swelling.  Respiratory: Respiration un-labored.  Lung fields clear to auscultation.  No wheezing, rales, rhonchi or pleural rub.           New Problems:  SINUSITIS, ACUTE (ICD-461.9)  New Medications:  BIAXIN 500 MG TABS (CLARITHROMYCIN) one twice a day  HYDROMET 5-1.5 MG/5ML SYRP (HYDROCODONE-HOMATROPINE) one-two tsp every 4-6 hours as needed      Preventive Maintenance       Coordinating Care Providers   PCP Name: Leanora Ivanoff MD            Prescriptions:  Heath Lark 5-1.5 MG/5ML SYRP (HYDROCODONE-HOMATROPINE) one-two tsp every 4-6 hours as needed  #6 x 0   Entered and Authorized by: Doree Fudge MD   Signed by: Doree Fudge MD on 07/22/2009   Method used: Print then Give to Patient   RxID: [id number]  BIAXIN 500 MG TABS (CLARITHROMYCIN) one twice a day  #20 x 0   Entered and Authorized by: Doree Fudge MD   Signed by: Doree Fudge MD on 07/22/2009   Method used: Print then Give to  Patient   RxID: [id number]      Assessment and Plan     Problems   Status of Existing Problems:  Assessed SINUSITIS, ACUTE as comment only - rx abx - Doree Fudge MD - Signed  New Problems:  Dx of SINUSITIS, ACUTE (ICD-461.9)     Medications   New Prescriptions/Refills:  HYDROMET 5-1.5 MG/5ML SYRP (HYDROCODONE-HOMATROPINE) one-two tsp every 4-6 hours as needed  #6 x 0, 07/22/2009, Doree Fudge MD  BIAXIN 500 MG TABS (CLARITHROMYCIN) one twice a day  #20 x 0, 07/22/2009, Doree Fudge MD    Today's Orders   [phone] - Ofc Vst, Est Level III [phone]                  ]

## 2009-08-03 NOTE — Unmapped (Signed)
Signed by Ernest Mallick MA on 08/03/2009 at 16:34:40    PHONE NOTE  Caller's Cell Phone #: [phone]  Caller: patient  Call for: dr Junius Faucett    Reason for Call: saw dr Austin Miles when you were out...diagnosis was sinus infection.. the meds given made him no better...can you call in different stronger meds..is now in chest- tight...dry cough...very tired has no strength. Dr. Elbert Ewings gave him meds and syrup.    Pharmacy Information: [id number]      Initial call taken by: Nolon Stalls,  August 03, 2009 12:10 PM      New Medications:  Prescriptions:  LEVAQUIN 500 MG TABS (LEVOFLOXACIN) 1 by mouth daily  #10 x 0   Entered by: Ernest Mallick MA   Authorized by: Leanora Ivanoff MD   Signed by: Ernest Mallick MA on 08/03/2009   Method used: Telephoned to ...     [facility] [place] (retail)     [address]     [place], [place]  [address]     Ph: [phone]     Fax: [phone]   RxID: [id number]  LEVAQUIN 500 MG TABS (LEVOFLOXACIN) 1 by mouth daily    FOLLOW UP  levaquin 500 daily #10  Follow-up by:  Leanora Ivanoff MD,  August 03, 2009 12:19 PM    FOLLOW UP  patient advised, Rx completed  Follow-up by:  Ernest Mallick MA,  August 03, 2009 4:34 PM    Prescriptions:  LEVAQUIN 500 MG TABS (LEVOFLOXACIN) 1 by mouth daily  #10 x 0   Entered by: Ernest Mallick MA   Authorized by: Leanora Ivanoff MD   Signed by: Ernest Mallick MA on 08/03/2009   Method used: Telephoned to ...     [facility] [place] (retail)     [address]     [place], [place]  [address]     Ph: [phone]     Fax: [phone]   RxID: [id number]

## 2014-04-30 ENCOUNTER — Encounter

## 2014-09-07 ENCOUNTER — Emergency Department: Admit: 2014-09-07 | Payer: Medicare (Managed Care)

## 2014-09-07 ENCOUNTER — Observation Stay
Admission: EM | Admit: 2014-09-07 | Discharge: 2014-09-08 | Disposition: A | Discharge status: Home / Self Care | Payer: Medicare (Managed Care)

## 2014-09-07 DIAGNOSIS — R079 Chest pain, unspecified: Principal | ICD-10-CM

## 2014-09-07 LAB — DIFFERENTIAL
Basophils Absolute: 36 /uL (ref 0–200)
Basophils Relative: 0.5 % (ref 0.0–1.0)
Eosinophils Absolute: 149 /uL (ref 15–500)
Eosinophils Relative: 2.1 % (ref 0.0–8.0)
Lymphocytes Absolute: 2414 /uL (ref 850–3900)
Lymphocytes Relative: 34 % (ref 15.0–45.0)
Monocytes Absolute: 738 /uL (ref 200–950)
Monocytes Relative: 10.4 % (ref 0.0–12.0)
Neutrophils Absolute: 3763 /uL (ref 1500–7800)
Neutrophils Relative: 53 % (ref 40.0–80.0)

## 2014-09-07 LAB — CBC
Hematocrit: 45.2 % (ref 38.5–50.0)
Hemoglobin: 15.2 g/dL (ref 13.2–17.1)
MCH: 30 pg (ref 27.0–33.0)
MCHC: 33.7 g/dL (ref 32.0–36.0)
MCV: 88.9 fL (ref 80.0–100.0)
MPV: 10.3 fL (ref 7.5–11.5)
Platelets: 161 10*3/uL (ref 140–400)
RBC: 5.08 10*6/uL (ref 4.20–5.80)
RDW: 13.4 % (ref 11.0–15.0)
WBC: 7.1 10*3/uL (ref 3.8–10.8)

## 2014-09-07 LAB — BASIC METABOLIC PANEL
Anion Gap: 9 mmol/L (ref 3–16)
BUN: 16 mg/dL (ref 7–25)
CO2: 24 mmol/L (ref 21–33)
Calcium: 9.4 mg/dL (ref 8.6–10.3)
Chloride: 106 mmol/L (ref 98–110)
Creatinine: 1.32 mg/dL (ref 0.60–1.30)
Glucose: 116 mg/dL (ref 70–100)
Osmolality, Calculated: 290 mOsm/kg (ref 278–305)
Potassium: 3.8 mmol/L (ref 3.5–5.3)
Sodium: 139 mmol/L (ref 133–146)
eGFR AA CKD-EPI: 64 See note.
eGFR NONAA CKD-EPI: 55 See note.

## 2014-09-07 LAB — TROPONIN I 0 HOUR: Troponin I: <0.04 ng/mL (ref 0.00–0.03)

## 2014-09-07 LAB — PROTIME-INR
INR: 1 (ref 0.9–1.1)
Protime: 13 seconds (ref 11.6–14.4)

## 2014-09-07 LAB — APTT: aPTT: 28.2 seconds (ref 24.3–33.1)

## 2014-09-07 LAB — TROPONIN I 90 MIN: Troponin I: <0.04 ng/mL (ref 0.00–0.03)

## 2014-09-07 LAB — MAGNESIUM: Magnesium: 2.2 mg/dL (ref 1.5–2.5)

## 2014-09-07 LAB — TSH: TSH: 2.32 u[IU]/mL (ref 0.34–5.60)

## 2014-09-07 MED ORDER — sodium chloride 0.9 % flush 10 mL
INTRAMUSCULAR | Status: AC
Start: 2014-09-07 — End: 2014-09-08
  Administered 2014-09-08 (×3): 10 mL via INTRAVENOUS

## 2014-09-07 MED ORDER — nitroGLYCERIN (NITROSTAT) SL tablet 0.4 mg
0.4 | Freq: Once | SUBLINGUAL | Status: AC
Start: 2014-09-07 — End: 2014-09-07
  Administered 2014-09-07: 22:00:00 0.4 mg via SUBLINGUAL

## 2014-09-07 MED ORDER — acetaminophen (TYLENOL) tablet 650 mg
325 | ORAL | Status: AC | PRN
Start: 2014-09-07 — End: 2014-09-08
  Administered 2014-09-08: 16:00:00 650 mg via ORAL

## 2014-09-07 MED ORDER — ondansetron (ZOFRAN) 4 mg/2 mL injection 4 mg
4 | Freq: Three times a day (TID) | INTRAMUSCULAR | Status: AC | PRN
Start: 2014-09-07 — End: 2014-09-08

## 2014-09-07 MED ORDER — multivitamin (THERAGRAN) tablet 1 tablet
Freq: Every day | ORAL | Status: AC
Start: 2014-09-07 — End: 2014-09-08
  Administered 2014-09-08: 16:00:00 1 via ORAL

## 2014-09-07 MED ORDER — aspirin tablet 325 mg
325 | Freq: Once | ORAL | Status: AC
Start: 2014-09-07 — End: 2014-09-07
  Administered 2014-09-07: 22:00:00 325 mg via ORAL

## 2014-09-07 MED ORDER — aspirin EC tablet 81 mg
81 | Freq: Every day | ORAL | Status: AC
Start: 2014-09-07 — End: 2014-09-08
  Administered 2014-09-08: 16:00:00 81 mg via ORAL

## 2014-09-07 MED ORDER — morphine injection 4 mg
4 | Freq: Once | INTRAMUSCULAR | Status: AC
Start: 2014-09-07 — End: 2014-09-07
  Administered 2014-09-07: 22:00:00 4 mg via INTRAVENOUS

## 2014-09-07 MED ORDER — ondansetron (ZOFRAN) tablet 4 mg
4 | Freq: Three times a day (TID) | ORAL | Status: AC | PRN
Start: 2014-09-07 — End: 2014-09-08

## 2014-09-07 MED ORDER — mometasone-formoterol (DULERA HFA) 100-5 mcg/actuation inhaler 2 puff
100-5 | Freq: Two times a day (BID) | RESPIRATORY_TRACT | Status: AC
Start: 2014-09-07 — End: 2014-09-08

## 2014-09-07 MED ORDER — enoxaparin (LOVENOX) for prophylaxis syringe 40 mg/0.4 mL
40 | Freq: Every day | SUBCUTANEOUS | Status: AC
Start: 2014-09-07 — End: 2014-09-08

## 2014-09-07 MED FILL — NITROSTAT 0.4 MG SUBLINGUAL TABLET: 0.4 0.4 mg | SUBLINGUAL | Qty: 25

## 2014-09-07 MED FILL — DULERA 100 MCG-5 MCG/ACTUATION HFA AEROSOL INHALER: 100-5 100-5 mcg/actuation | RESPIRATORY_TRACT | Qty: 8.8

## 2014-09-07 MED FILL — MORPHINE 4 MG/ML INJECTION SYRINGE: 4 4 mg/mL | INTRAMUSCULAR | Qty: 1

## 2014-09-07 MED FILL — ASPIRIN 325 MG TABLET: 325 325 MG | ORAL | Qty: 1

## 2014-09-07 NOTE — Unmapped (Signed)
Problem: Pain  Goal: Patient???s pain is progressing toward patient???s stated pain goal  Assess and monitor patient???s pain using appropriate pain scale. Collaborate with interdisciplinary team and initiate plan and interventions as ordered. Re-assess patient???s pain level 30 - 60 minutes after pain management intervention.   Outcome: Progressing    Problem: Safety  Goal: Patient will be injury free during hospitalization  Assess and monitor vitals signs, neurological status including level of consciousness and orientation. Assess patient???s risk for falls and implement fall prevention plan of care and interventions per hospital policy.     Ensure arm band on, uncluttered walking paths in room, adequate room lighting, call light and overbed table within reach, bed in low position, wheels locked, side rails up per policy, and non-skid footwear provided.   Outcome: Progressing    Problem: Fall Prevention  Goal: Patient will remain free of falls  Assess and monitor vitals signs, neurological status including level of consciousness and orientation. Reassess fall risk per hospital policy.    Ensure arm band on, uncluttered walking paths in room, adequate room lighting, call light and overbed table within reach, bed in low position, wheels locked, side rails up per policy, and non-skid footwear provided.   Outcome: Progressing    Problem: Daily Care  Goal: Daily care needs are met  Assess and monitor ability to perform self care and identify potential discharge needs.   Outcome: Progressing    Problem: Psychosocial Needs  Goal: Demonstrates ability to cope with hospitalization/illness  Assess and monitor patients ability to cope with his/her illness.   Outcome: Progressing    Problem: Discharge Barriers  Goal: Patient???s discharge needs are met  Collaborate with interdisciplinary team and initiate plans and interventions as needed.   Outcome: Progressing

## 2014-09-07 NOTE — Unmapped (Signed)
Pt reports to ED c/p 4 days of CP, pt alert and oriented, EKG completed, ambulated to room steady gait

## 2014-09-07 NOTE — Unmapped (Signed)
Patient is resting comfortably.

## 2014-09-07 NOTE — Unmapped (Signed)
Patient updated on plan of care and informed of any delays.  Call light is in reach and bed rails up for safety.  No concerns voiced at this time. The patient was advised to use call light if any concerns or questions.

## 2014-09-07 NOTE — Unmapped (Signed)
Admission documentation complete.    Angie B. RN  [phone]

## 2014-09-07 NOTE — Unmapped (Signed)
Pt tolerated transfer to floor from ED well. Family at bedside. All questions answered at this time. Oriented to room, use of bed, and use of call light. Dinner ordered, Pt NPO at midnight. Tele placed and [facility] notified. Bed in lowest position and call light within reach. Will continue to monitor and maintain safety. MD at bedside.    Larry Pugh

## 2014-09-07 NOTE — Unmapped (Signed)
Family at bedside.

## 2014-09-07 NOTE — Unmapped (Signed)
[facility]    Hospitalist History and Physical    Admit Date: 09/07/2014     Patient's PCP: Horton Chin, MD  Name: Larry Pugh  DOB: 05-18-1945  ZOX:[id number]  09/07/2014    Chief Complaint   Patient presents with   ??? Chest Pain     Observation  7:19 PM   09/07/2014          History of Present Illness:    ALDRIC WENZLER is a very pleasant 69 y.o. male with a history of multiple medical problems presenting for evaluation of CHEST PAIN  ASTHMA   BETHEL GAGLIO has otherwise been in their usual state of health prior to this presentation.  The following is from the ER report I have reviewed and made the necessary changes :  69 year old gentleman who presents to the emergency department today with a chief complaint of 3-4 days of intermittent substernal chest pressure.?? He notes that the discomfort is nonradiating.?? He denies any back pain, shortness of breath, leg swelling, but does state that the pain is worse when walking upstairs.?? He denies any fevers, chills, coughs, abdominal pain, nausea, vomiting, or diaphoresis.?? The patient states he has had a stress test proximally 5 years ago which was reportedly negative.?? He has had an echocardiogram performed 2 weeks ago and he states this showed some valvular dysfunction.?? The patient receives some of his care at the [facility].   Past Medical / Surgical History:      Past Medical History   Diagnosis Date   ??? Asthma      History reviewed. No pertinent past surgical history.     Medications:      Current Discharge Medication List      CONTINUE these medications which have NOT CHANGED    Details   aspirin 81 MG EC tablet Take 81 mg by mouth daily.      budesonide-formoterol (SYMBICORT) 160-4.5 mcg/actuation inhaler Inhale 2 puffs into the lungs 2 times a day.      multivitamin (THERAGRAN) tablet Take 1 tablet by mouth daily.             Immunizatons:      There is no immunization history on file for this patient.     Allergies:    Review of  patient's allergies indicates no known allergies.     Social History:    TOBACCO:   reports that he quit smoking about 36 years ago. His smoking use included Cigarettes. He has a 40 pack-year smoking history. He has never used smokeless tobacco.       ETOH:   reports that he drinks about 1.8 oz of alcohol per week.  DRUG:   History   Drug Use Not on file        Family History:    Father deceased  chf  cad  Mother deceased  chf cad  Brother alive cad  Sister alive cad   Review of Systems:    Pertinent Review of Systems - History obtained from chart review and the patient  General ROS: all negative  Psychological ROS: all negative  Ophthalmic ROS: negative  ENT ROS: all negative  Allergy and Immunology ROS: negative  Hematological and Lymphatic ROS: all negative  Endocrine ROS: all  negative  Respiratory ROS: all negative  Cardiovascular ROS: positive for - chest pain and irregular heartbeat  Gastrointestinal ROS: all negative  Genito-Urinary ROS: no dysuria, trouble voiding, or hematuria  Musculoskeletal ROS:  all negative  Neurological ROS: all negative  Dermatological ROS: all negative.   Otherwise a 10-organ ROS was obtained and otherwise unremarkable.     Physical Exam:    BP 124/94 mmHg   Pulse 66   Temp(Src) 98.1 ??F (36.7 ??C) (Oral)   Resp 21   Ht 6' 2 (1.88 m)   Wt 215 lb (97.523 kg)   BMI 27.59 kg/m2   SpO2 96%    General appearance: alert, appears stated age, cooperative and no distress  Head: Normocephalic, without obvious abnormality, atraumatic  Eyes: conjunctivae/corneas clear. PERRL, EOM's intact. Fundi benign.  Ears: normal TM's and external ear canals both ears  Nose: Nares normal. Septum midline. Mucosa normal. No drainage or sinus tenderness.  Throat: lips, mucosa, and tongue normal; teeth and gums normal  Neck: no adenopathy, no carotid bruit, no JVD, supple, symmetrical, trachea midline and thyroid not enlarged, symmetric, no tenderness/mass/nodules  Lungs: clear to auscultation bilaterally and no  respiratory distress  Heart: regular rate and rhythm, S1, S2 normal, no murmur, click, rub or gallop  Abdomen: soft, non-tender; bowel sounds normal; no masses,  no organomegaly  Extremities: extremities normal, atraumatic, no cyanosis or edema  Pulses: 2+ and symmetric  Skin: Skin color, texture, turgor normal. No rashes or lesions  Lymph nodes: Cervical, supraclavicular, and axillary nodes normal.  Neurologic: Grossly normal       Diagnostic Studies:      Recent Labs      09/07/14   1738   WBC  7.1   HGB  15.2   HCT  45.2   PLT  161                                                                  Recent Labs      09/07/14   1738   NA  139   K  3.8   CL  106   CO2  24   BUN  16   CREATININE  1.32*   GLUCOSE  116*     No results for input(s): AST, ALT in the last 72 hours.    Invalid input(s): TOTAL BILIRUBIN  Recent Labs      09/07/14   1738  09/07/14   1837   TROPONINI  <0.04  <0.04       EKG normal sinus rhythm at a rate of 69 beats  PCXR   IMPRESSION:  No acute cardiopulmonary disease.    Assessment & Plan:   CHEST PAIN:    Monitor closely on telemetry, serial cardiac enzymes, lipid panel,TSH, risk stratification      ASA, ACE / ARB,. B-Blocker, NTG paste  / sublingual.  morphine IV. ECHO and NM sress test   Cardiology Consult   ASTHMA  Continue inhalers prn  The patient was informed of the results of any tests, a time was given to answer  questions, a plan was proposed and we agreed with plan.    Thank you Horton Chin, MD for the opportunity to be involved in your patients care. If you have any questions or concerns regarding this hospitalization, please feel free to give [place] a call at [phone].    No Order     Lourdes Sledge Vinie Charity D.O.

## 2014-09-07 NOTE — Unmapped (Signed)
[facility] ED Note    Date of service:  09/07/2014    Reason for Visit: Chest Pain      Patient History     HPI  This is a 69 year old gentleman who presents to the emergency department today with a chief complaint of 3-4 days of intermittent substernal chest pressure.  He notes that the discomfort is nonradiating.  He denies any back pain, shortness of breath, leg swelling, but does state that the pain is worse when walking upstairs.  He denies any fevers, chills, coughs, abdominal pain, nausea, vomiting, or diaphoresis.  The patient states he has had a stress test proximally 5 years ago which was reportedly negative.  He has had an echocardiogram performed 2 weeks ago and he states this showed some valvular dysfunction.  The patient receives some of his care at the [facility].     Past Medical History   Diagnosis Date   ??? Asthma        History reviewed. No pertinent past surgical history.    Patient  reports that he has quit smoking. He has never used smokeless tobacco. He reports that he drinks alcohol. His drug history is not on file.      Previous Medications    BUDESONIDE-FORMOTEROL (SYMBICORT) 160-4.5 MCG/ACTUATION INHALER    Inhale 2 puffs into the lungs 2 times a day.       Allergies:   Allergies as of 09/07/2014   ??? (No Known Allergies)       Review of Systems     Review of Systems   Constitutional: Negative for fever, chills and fatigue.   Eyes: Negative for visual disturbance.   Respiratory: Positive for chest tightness. Negative for cough, choking, shortness of breath, wheezing and stridor.    Cardiovascular: Positive for chest pain. Negative for palpitations and leg swelling.   Gastrointestinal: Negative for nausea, vomiting, abdominal pain, diarrhea and constipation.   Genitourinary: Negative for dysuria and flank pain.   Musculoskeletal: Negative for back pain, arthralgias, neck pain and neck stiffness.   Skin: Negative for  pallor and wound.   Neurological: Negative for tremors, seizures, syncope, weakness, light-headedness and headaches.   Hematological: Negative for adenopathy.           Physical Exam     ED Triage Vitals   Vital Signs Group      Temp 09/07/14 1654 98.1 ??F (36.7 ??C)      Temp Source 09/07/14 1654 Oral      Heart Rate 09/07/14 1654 72      Heart Rate Source 09/07/14 1654 Monitor      Resp 09/07/14 1654 15      SpO2 09/07/14 1654 100 %      BP 09/07/14 1654 154/100 mmHg      BP Location 09/07/14 1654 Right arm      BP Method 09/07/14 1654 Automatic      Patient Position 09/07/14 1654 Sitting   SpO2 09/07/14 1654 100 %   O2 Device 09/07/14 1654 None (Room air)       Physical Exam   Nursing note and vitals reviewed.  Constitutional: He appears well-developed and well-nourished.   HENT:   Head: Normocephalic and atraumatic.   Eyes: EOM are normal. Pupils are equal, round, and reactive to light.   Cardiovascular: Normal rate and regular rhythm.  Exam reveals no gallop and no friction rub.    No murmur heard.  Pulmonary/Chest: Effort normal. No respiratory distress. He has no  wheezes. He has no rales. He exhibits no tenderness.   Abdominal: Soft. He exhibits no distension. There is no tenderness.   Musculoskeletal: Normal range of motion. He exhibits no edema or tenderness.   Neurological: He is oriented to person, place, time and situation.  Normal speech without aphasia or dysarthria.    Skin: Skin is warm and dry.   Psychiatric: He has a normal mood and affect.         Diagnostic Studies     Labs:    Please see electronic medical record for any tests performed in the ED    Radiology:    Please see electronic medical record for any tests performed in the ED    EKG:    Indication an EKG was performed demonstrating normal sinus rhythm at a rate of 69 beats per minutes with occasional PVCs but otherwise normal axis and intervals and no evidence of acute ischemia or dysrhythmia    Emergency Department Procedures      Procedures    ED Course and MDM     Larry Pugh is a 69 y.o. male who presented to the emergency department with Chest Pain      This is a 69 year old gentleman who presents to the emergency department today with a chief complaint of anterior chest pressure.  The patient has not had a recent stress test and receives most of his care at the [place].  Given his history and physical examination I think this is unlikely to be pulmonary embolus or aortic dissection.    HEART score-4    The patient had an initial negative troponin.  Given his risk factors I will admit him for further management and possibly stress test.    Impression:  1.  Chest pain       Critical Care Time (Attendings)           Sharlene Motts, MD  09/07/14 [phone]

## 2014-09-08 ENCOUNTER — Observation Stay: Admit: 2014-09-08 | Payer: Medicare (Managed Care)

## 2014-09-08 ENCOUNTER — Ambulatory Visit: Payer: Medicare (Managed Care)

## 2014-09-08 LAB — CBC
Hematocrit: 43 % (ref 38.5–50.0)
Hemoglobin: 14.6 g/dL (ref 13.2–17.1)
MCH: 30.2 pg (ref 27.0–33.0)
MCHC: 33.9 g/dL (ref 32.0–36.0)
MCV: 89.1 fL (ref 80.0–100.0)
MPV: 10.4 fL (ref 7.5–11.5)
Platelets: 148 10*3/uL (ref 140–400)
RBC: 4.83 10*6/uL (ref 4.20–5.80)
RDW: 13.8 % (ref 11.0–15.0)
WBC: 6.4 10*3/uL (ref 3.8–10.8)

## 2014-09-08 LAB — BASIC METABOLIC PANEL
Anion Gap: 6 mmol/L (ref 3–16)
BUN: 18 mg/dL (ref 7–25)
CO2: 26 mmol/L (ref 21–33)
Calcium: 8.9 mg/dL (ref 8.6–10.3)
Chloride: 107 mmol/L (ref 98–110)
Creatinine: 1.21 mg/dL (ref 0.60–1.30)
Glucose: 100 mg/dL (ref 70–100)
Osmolality, Calculated: 290 mOsm/kg (ref 278–305)
Potassium: 4.3 mmol/L (ref 3.5–5.3)
Sodium: 139 mmol/L (ref 133–146)
eGFR AA CKD-EPI: 71 See note.
eGFR NONAA CKD-EPI: 61 See note.

## 2014-09-08 LAB — CK, CARDIAC ENZYME: Total CK: 357 U/L (ref 30–223)

## 2014-09-08 LAB — LIPID PANEL
Cholesterol, Total: 127 mg/dL (ref 0–200)
HDL: 38 mg/dL (ref 60–92)
LDL Cholesterol: 46 mg/dL
Triglycerides: 213 mg/dL (ref 10–149)

## 2014-09-08 LAB — TROPONIN I
Troponin I: <0.04 ng/mL (ref 0.00–0.03)
Troponin I: <0.04 ng/mL (ref 0.00–0.03)

## 2014-09-08 MED ORDER — nitroGLYCERIN (NITROSTAT) SL tablet 0.4 mg
0.4 | SUBLINGUAL | Status: AC | PRN
Start: 2014-09-08 — End: 2014-09-08
  Administered 2014-09-08: 13:00:00 0.4 mg via SUBLINGUAL

## 2014-09-08 MED ORDER — Tc-99m technetium tetrofosmin (MYOVIEW) 40 milli Curie
Freq: Once | INTRAVENOUS | Status: AC | PRN
Start: 2014-09-08 — End: 2014-09-08
  Administered 2014-09-08: 13:00:00 40 via INTRAVENOUS

## 2014-09-08 MED ORDER — Tc-99m technetium tetrofosmin (MYOVIEW) 14 milli Curie
Freq: Once | INTRAVENOUS | Status: AC | PRN
Start: 2014-09-08 — End: 2014-09-08
  Administered 2014-09-08: 11:00:00 14 via INTRAVENOUS

## 2014-09-08 MED ORDER — morphine injection 4 mg
4 | Freq: Once | INTRAMUSCULAR | Status: AC
Start: 2014-09-08 — End: 2014-09-08

## 2014-09-08 MED ORDER — sodium chloride 0.9 % flush 5 mL
Freq: Once | INTRAMUSCULAR | Status: AC
Start: 2014-09-08 — End: 2014-09-08
  Administered 2014-09-08: 13:00:00 5 mL via INTRAVENOUS

## 2014-09-08 MED ORDER — metoprolol tartrate (LOPRESSOR) 25 MG tablet
25 | ORAL_TABLET | Freq: Two times a day (BID) | ORAL | 0 refills | Status: AC
Start: 2014-09-08 — End: 2015-09-08
  Filled 2014-09-08: qty 60, 30d supply, fill #0

## 2014-09-08 MED ORDER — regadenoson (LEXISCAN) 0.4 mg/5 mL syringe 0.4 mg
0.4 | Freq: Once | INTRAVENOUS | Status: AC
Start: 2014-09-08 — End: 2014-09-08
  Administered 2014-09-08: 13:00:00 0.4 mg via INTRAVENOUS

## 2014-09-08 MED ORDER — morphine 4 mg/mL injection
4 | INTRAMUSCULAR | Status: AC
Start: 2014-09-08 — End: 2014-09-08
  Administered 2014-09-08: 13:00:00 2 via INTRAVENOUS

## 2014-09-08 MED ORDER — aminophylline 250 mg/10 mL injection 50-100 mg
250 | Freq: Once | INTRAVENOUS | Status: AC | PRN
Start: 2014-09-08 — End: 2014-09-08

## 2014-09-08 MED FILL — MORPHINE 4 MG/ML INJECTION SYRINGE: 4 4 mg/mL | INTRAMUSCULAR | Qty: 1

## 2014-09-08 MED FILL — THERA 400 MCG TABLET: 400 400 mcg | ORAL | Qty: 1

## 2014-09-08 MED FILL — ASPIRIN 81 MG TABLET,DELAYED RELEASE: 81 81 MG | ORAL | Qty: 1

## 2014-09-08 MED FILL — TYLENOL 325 MG TABLET: 325 325 mg | ORAL | Qty: 2

## 2014-09-08 NOTE — Unmapped (Signed)
Pt discharged. IV and tele monitor removed. Discharge paperwork reviewed with pt and pt son. Both verbalized understanding. Pt left with all belongings in private vehicle to private residence. Pt left in stable condition.

## 2014-09-08 NOTE — Unmapped (Signed)
[facility]  Discharge Summary      Patient's PCP:  Horton Chin, MD  Name: Larry Pugh  DOB: 1946-02-12  MRN: [id number]     Admit Date: 09/07/2014   Admit Time:  1838  Discharge Date: 09/08/2014  Patient Class: Observation    Admitting Physician:  Gordy Savers, DO  Discharge Physician:  Ezzard Standing, DO, SFHM    Consults:        Diagnoses:  -  Chest pain, atypical  -  PACs  -  Asthma  -  HTN  - LVH    Hospital Course:  Under obs for chest pain.  Atypical features.  Trops, EKG, stress test neg for ischemia.  LVH on TTE, no WMA, EF nl.  Noncardiac etiol of chest discomfort.  To f/u w/ PCP 1 week.    PACs, ventricular bigeminy initially.  Will start low-dose BB to settle ectopy.  Has BP cuff- advised to monitor BP/HR qd, record, take to PCP for review.      On exam today: VSS/RRR/CTAB/NAD     Diagnostic Studies/Procedures:   TTE: EF nl; LVH  Stress test: no isch     Pending studies/lab:      Discharge Medications:      Medication List      TAKE these medications, which are NEW       Quantity/Refills    metoprolol tartrate 25 MG tablet   Commonly known as:  LOPRESSOR   Take 1 tablet (25 mg total) by mouth 2 times a day.    Quantity:  60 tablet   Refills:  0         TAKE these medications, which you were ALREADY TAKING       Quantity/Refills    aspirin 81 MG EC tablet   Take 81 mg by mouth daily.    Refills:  0       budesonide-formoterol 160-4.5 mcg/actuation inhaler   Commonly known as:  SYMBICORT   Inhale 2 puffs into the lungs 2 times a day.    Refills:  0       fluticasone 50 mcg/actuation nasal spray   Commonly known as:  FLONASE   Use 2 sprays into each nostril daily.    Refills:  0       multivitamin tablet   Commonly known as:  THERAGRAN   Take 1 tablet by mouth daily.    Refills:  0         Where to Get Your Medications     You need to pick up these prescriptions. We sent them to a specific pharmacy, so go there to get them.           [facility]   -  metoprolol tartrate 25 MG  tablet    [address]   [place] [address]   Phone:  [phone]   Hours:  Monday - Friday: 9:00AM - 5:00PM                        Discharge Instructions:       Activity: activity as tolerated    Diet: DASH      Disposition: home    Follow [place]  [address]  [address] [place] [address]  [phone]    Schedule an appointment as soon as possible for a visit in 1 week  Total time spent on this discharge was 35 minutes including time spent with the patient, coordinating care, reviewing data, and completing medical records.    Thank you for the opportunity to be involved in your patient's care. If you have any questions or concerns regarding this hospitalization, please feel free to give me a call at [phone].       Azalee Weimer, DO, SFHM  Pager [phone]  09/08/2014, 7:23 PM

## 2014-09-08 NOTE — Unmapped (Signed)
Problem: Discharge Planning  Goal: Identify discharge needs  Outcome: Adequate for Discharge Date Met:  09/08/14  The discharge plan is to return home independently and no needs identified

## 2014-09-08 NOTE — Unmapped (Signed)
Pt returned from testing. Pt alert and oriented x4. States headache 5/10 from nitro. Tylenol given per MAR. SR-SB w/ frequent PVCs on tele. Assessment completed per flow sheet. Pt sitting on couch, son at bedside. Will continue to monitor.

## 2014-09-08 NOTE — Unmapped (Signed)
Metoprolol tablets °What is this medicine? °METOPROLOL (me TOE proe lole) is a beta-blocker. Beta-blockers reduce the workload on the heart and help it to beat more regularly. This medicine is used to treat high blood pressure and to prevent chest pain. It is also used to after a heart attack and to prevent an additional heart attack from occurring. °This medicine may be used for other purposes; ask your health care provider or pharmacist if you have questions. °COMMON BRAND NAME(S): Lopressor °What should I tell my health care provider before I take this medicine? °They need to know if you have any of these conditions: °-diabetes °-heart or vessel disease like slow heart rate, worsening heart failure, heart block, sick sinus syndrome or Raynaud's disease °-kidney disease °-liver disease °-lung or breathing disease, like asthma or emphysema °-pheochromocytoma °-thyroid disease °-an unusual or allergic reaction to metoprolol, other beta-blockers, medicines, foods, dyes, or preservatives °-pregnant or trying to get pregnant °-breast-feeding °How should I use this medicine? °Take this medicine by mouth with a drink of water. Follow the directions on the prescription label. Take this medicine immediately after meals. Take your doses at regular intervals. Do not take more medicine than directed. Do not stop taking this medicine suddenly. This could lead to serious heart-related effects. °Talk to your pediatrician regarding the use of this medicine in children. Special care may be needed. °Overdosage: If you think you have taken too much of this medicine contact a poison control center or emergency room at once. °NOTE: This medicine is only for you. Do not share this medicine with others. °What if I miss a dose? °If you miss a dose, take it as soon as you can. If it is almost time for your next dose, take only that dose. Do not take double or extra doses. °What may interact with this medicine? °This medicine may interact  with the following medications: °-certain medicines for blood pressure, heart disease, irregular heart beat °-certain medicines for depression like monoamine oxidase (MAO) inhibitors, fluoxetine, or paroxetine °-clonidine °-dobutamine °-epinephrine °-isoproterenol °-reserpine °This list may not describe all possible interactions. Give your health care provider a list of all the medicines, herbs, non-prescription drugs, or dietary supplements you use. Also tell them if you smoke, drink alcohol, or use illegal drugs. Some items may interact with your medicine. °What should I watch for while using this medicine? °Visit your doctor or health care professional for regular check ups. Contact your doctor right away if your symptoms worsen. Check your blood pressure and pulse rate regularly. Ask your health care professional what your blood pressure and pulse rate should be, and when you should contact them. °You may get drowsy or dizzy. Do not drive, use machinery, or do anything that needs mental alertness until you know how this medicine affects you. Do not sit or stand up quickly, especially if you are an older patient. This reduces the risk of dizzy or fainting spells. Contact your doctor if these symptoms continue. Alcohol may interfere with the effect of this medicine. Avoid alcoholic drinks. °What side effects may I notice from receiving this medicine? °Side effects that you should report to your doctor or health care professional as soon as possible: °-allergic reactions like skin rash, itching or hives °-cold or numb hands or feet °-depression °-difficulty breathing °-faint °-fever with sore throat °-irregular heartbeat, chest pain °-rapid weight gain °-swollen legs or ankles °Side effects that usually do not require medical attention (report to your doctor or health care professional   numb hands or feet  -depression  -difficulty breathing  -faint  -fever with sore throat  -irregular heartbeat, chest pain  -rapid weight gain  -swollen legs or ankles  Side effects that usually do not require medical attention (report to your doctor or health care professional if they continue or are bothersome):  -anxiety or nervousness  -change in sex drive or performance  -dry  skin  -headache  -nightmares or trouble sleeping  -short term memory loss  -stomach upset or diarrhea  -unusually tired  This list may not describe all possible side effects. Call your doctor for medical advice about side effects. You may report side effects to FDA at 1-800-FDA-1088.  Where should I keep my medicine?  Keep out of the reach of children.  Store at room temperature between 15 and 30 degrees C (59 and 86 degrees F). Throw away any unused medicine after the expiration date.  NOTE: This sheet is a summary. It may not cover all possible information. If you have questions about this medicine, talk to your doctor, pharmacist, or health care provider.  ?? 2015, Elsevier/[employer] Standard. (2012-12-07 14:40:36)  Chest Pain (Nonspecific)  It is often hard to give a specific diagnosis for the cause of chest pain. There is always a chance that your pain could be related to something serious, such as a heart attack or a blood clot in the lungs. You need to follow up with your health care provider for further evaluation.  CAUSES   ?? Heartburn.  ?? Pneumonia or bronchitis.  ?? Anxiety or stress.  ?? Inflammation around your heart (pericarditis) or lung (pleuritis or pleurisy).  ?? A blood clot in the lung.  ?? A collapsed lung (pneumothorax). It can develop suddenly on its own (spontaneous pneumothorax) or from trauma to the chest.  ?? Shingles infection (herpes zoster virus).  The chest wall is composed of bones, muscles, and cartilage. Any of these can be the source of the pain.  ?? The bones can be bruised by injury.  ?? The muscles or cartilage can be strained by coughing or overwork.  ?? The cartilage can be affected by inflammation and become sore (costochondritis).  DIAGNOSIS   Lab tests or other studies may be needed to find the cause of your pain. Your health care provider may have you take a test called an ambulatory electrocardiogram (ECG). An ECG records your heartbeat patterns over a 24-hour period. You may also have  other tests, such as:  ?? Transthoracic echocardiogram (TTE). During echocardiography, sound waves are used to evaluate how blood flows through your heart.  ?? Transesophageal echocardiogram (TEE).  ?? Cardiac monitoring. This allows your health care provider to monitor your heart rate and rhythm in real time.  ?? Holter monitor. This is a portable device that records your heartbeat and can help diagnose heart arrhythmias. It allows your health care provider to track your heart activity for several days, if needed.  ?? Stress tests by exercise or by giving medicine that makes the heart beat faster.  TREATMENT   ?? Treatment depends on what may be causing your chest pain. Treatment may include:  ?? Acid blockers for heartburn.  ?? Anti-inflammatory medicine.  ?? Pain medicine for inflammatory conditions.  ?? Antibiotics if an infection is present.  ?? You may be advised to change lifestyle habits. This includes stopping smoking and avoiding alcohol, caffeine, and chocolate.  ?? You may be advised to keep your head raised (elevated) when sleeping. This reduces  the chance of acid going backward from your stomach into your esophagus.  Most of the time, nonspecific chest pain will improve within 2-3 days with rest and mild pain medicine.   HOME CARE INSTRUCTIONS   ?? If antibiotics were prescribed, take them as directed. Finish them even if you start to feel better.  ?? For the next few days, avoid physical activities that bring on chest pain. Continue physical activities as directed.  ?? Do not use any tobacco products, including cigarettes, chewing tobacco, or electronic cigarettes.  ?? Avoid drinking alcohol.  ?? Only take medicine as directed by your health care provider.  ?? Follow your health care provider's suggestions for further testing if your chest pain does not go away.  ?? Keep any follow-up appointments you made. If you do not go to an appointment, you could develop lasting (chronic) problems with pain. If there is any  problem keeping an appointment, call to reschedule.  SEEK MEDICAL CARE IF:   ?? Your chest pain does not go away, even after treatment.  ?? You have a rash with blisters on your chest.  ?? You have a fever.  SEEK IMMEDIATE MEDICAL CARE IF:   ?? You have increased chest pain or pain that spreads to your arm, neck, jaw, back, or abdomen.  ?? You have shortness of breath.  ?? You have an increasing cough, or you cough up blood.  ?? You have severe back or abdominal pain.  ?? You feel nauseous or vomit.  ?? You have severe weakness.  ?? You faint.  ?? You have chills.  This is an emergency. Do not wait to see if the pain will go away. Get medical help at once. Call your local emergency services (911 in [place] Do not drive yourself to the hospital.  MAKE SURE YOU:   ?? Understand these instructions.  ?? Will watch your condition.  ?? Will get help right away if you are not doing well or get worse.  Document Released: 01/12/2005 Document Revised: 04/09/2013 Document Reviewed: 11/08/2007  ExitCare?? Patient Information ??2015 [place], [place]. This information is not intended to replace advice given to you by your health care provider. Make sure you discuss any questions you have with your health care provider.  DASH Eating Plan  DASH stands for Dietary Approaches to Stop Hypertension. The DASH eating plan is a healthy eating plan that has been shown to reduce high blood pressure (hypertension). Additional health benefits may include reducing the risk of type 2 diabetes mellitus, heart disease, and stroke. The DASH eating plan may also help with weight loss.  WHAT DO I NEED TO KNOW ABOUT THE DASH EATING PLAN?  For the DASH eating plan, you will follow these general guidelines:  ?? Choose foods with a percent daily value for sodium of less than 5% (as listed on the food label).  ?? Use salt-free seasonings or herbs instead of table salt or sea salt.  ?? Check with your health care provider or pharmacist before using salt substitutes.  ?? Eat  lower-sodium products, often labeled as lower sodium or no salt added.  ?? Eat fresh foods.  ?? Eat more vegetables, fruits, and low-fat dairy products.  ?? Choose whole grains. Look for the word whole as the first word in the ingredient list.  ?? Choose fish and skinless chicken or [place] more often than red meat. Limit fish, poultry, and meat to 6 oz (170 g) each day.  ?? Limit sweets, desserts, sugars,  and sugary drinks.  ?? Choose heart-healthy fats.  ?? Limit cheese to 1 oz (28 g) per day.  ?? Eat more home-cooked food and less restaurant, buffet, and fast food.  ?? Limit fried foods.  ?? Cook foods using methods other than frying.  ?? Limit canned vegetables. If you do use them, rinse them well to decrease the sodium.  ?? When eating at a restaurant, ask that your food be prepared with less salt, or no salt if possible.  WHAT FOODS CAN I EAT?  Seek help from a dietitian for individual calorie needs.  Grains  Whole grain or whole wheat bread. Brown rice. Whole grain or whole wheat pasta. Quinoa, bulgur, and whole grain cereals. Low-sodium cereals. Corn or whole wheat flour tortillas. Whole grain cornbread. Whole grain crackers. Low-sodium crackers.  Vegetables  Fresh or frozen vegetables (raw, steamed, roasted, or grilled). Low-sodium or reduced-sodium tomato and vegetable juices. Low-sodium or reduced-sodium tomato sauce and paste. Low-sodium or reduced-sodium canned vegetables.   Fruits  All fresh, canned (in natural juice), or frozen fruits.  Meat and Other Protein Products  Ground beef (85% or leaner), grass-fed beef, or beef trimmed of fat. Skinless chicken or [place]. Ground chicken or [place]. Pork trimmed of fat. All fish and seafood. Eggs. Dried beans, peas, or lentils. Unsalted nuts and seeds. Unsalted canned beans.  Dairy  Low-fat dairy products, such as skim or 1% milk, 2% or reduced-fat cheeses, low-fat ricotta or cottage cheese, or plain low-fat yogurt. Low-sodium or reduced-sodium cheeses.  Fats and  Oils  Tub margarines without trans fats. Light or reduced-fat mayonnaise and salad dressings (reduced sodium). Avocado. Safflower, olive, or canola oils. Natural peanut or almond butter.  Other  Unsalted popcorn and pretzels.  The items listed above may not be a complete list of recommended foods or beverages. Contact your dietitian for more options.  WHAT FOODS ARE NOT RECOMMENDED?  Grains  White bread. White pasta. White rice. Refined cornbread. Bagels and croissants. Crackers that contain trans fat.  Vegetables  Creamed or fried vegetables. Vegetables in a cheese sauce. Regular canned vegetables. Regular canned tomato sauce and paste. Regular tomato and vegetable juices.  Fruits  Dried fruits. Canned fruit in light or heavy syrup. Fruit juice.  Meat and Other Protein Products  Fatty cuts of meat. Ribs, chicken wings, bacon, sausage, bologna, salami, chitterlings, fatback, hot dogs, bratwurst, and packaged luncheon meats. Salted nuts and seeds. Canned beans with salt.  Dairy  Whole or 2% milk, cream, half-and-half, and cream cheese. Whole-fat or sweetened yogurt. Full-fat cheeses or blue cheese. Nondairy creamers and whipped toppings. Processed cheese, cheese spreads, or cheese curds.  Condiments  Onion and garlic salt, seasoned salt, table salt, and sea salt. Canned and packaged gravies. Worcestershire sauce. Tartar sauce. Barbecue sauce. Teriyaki sauce. Soy sauce, including reduced sodium. Steak sauce. Fish sauce. Oyster sauce. Cocktail sauce. Horseradish. Ketchup and mustard. Meat flavorings and tenderizers. Bouillon cubes. Hot sauce. Tabasco sauce. Marinades. Taco seasonings. Relishes.  Fats and Oils  Butter, stick margarine, lard, shortening, ghee, and bacon fat. Coconut, palm kernel, or palm oils. Regular salad dressings.  Other  Pickles and olives. Salted popcorn and pretzels.  The items listed above may not be a complete list of foods and beverages to avoid. Contact your dietitian for more  information.  WHERE CAN I FIND MORE INFORMATION?  National Heart, Lung, and Blood Institute: [employer]  Document Released: 03/24/2011 Document Revised: 08/19/2013 Document Reviewed: 02/06/2013  ExitCare?? Patient Information ??2015 [place] [employer]. This  information is not intended to replace advice given to you by your health care provider. Make sure you discuss any questions you have with your health care provider.

## 2014-09-08 NOTE — Unmapped (Signed)
Medication Reconciliation  [place] [facility]    Patient Name: Larry Pugh March 05, 1946    Medication reconciliation has been completed by: Pharmacy Technician    Source(s) of information:  [facility], [facility]     Primary Care Physician: Horton Chin, MD    Pharmacy: No Pharmacies Listed    No Known Allergies    Prior to Admission medications taking for visit date 09/07/14   Medication Sig Taking? Authorizing Provider   aspirin 81 MG EC tablet Take 81 mg by mouth daily. Yes Historical Provider, MD   budesonide-formoterol (SYMBICORT) 160-4.5 mcg/actuation inhaler Inhale 2 puffs into the lungs 2 times a day. Yes Historical Provider, MD   fluticasone (FLONASE) 50 mcg/actuation nasal spray Use 2 sprays into each nostril daily. Yes Historical Provider, MD   multivitamin [facility]) tablet Take 1 tablet by mouth daily. Yes Historical Provider, MD         Medications flagged for removal (include reason, ex. noncompliance, medication cost, therapy complete etc.):    ?? None    Other notes (antibiotics, medication pumps, insulin, patches, cream/ointments, tapers, warfarin etc.):     -- None    To my knowledge the above medication reconciliation is accurate as of 09/08/2014 9:14 AM.        Cinthia Rodden C Travonne Schowalter, CPhT   09/08/2014 9:14 AM  [phone]

## 2014-09-08 NOTE — Unmapped (Signed)
[facility]  Case Management/Social Work Department  Discharge Planning Assessment  Patient Information   Current Mental Status: alert and oriented  Patient lives with: alone  Type of Home:   Number of Steps:   Level of Activity Prior to Admission: Independent           Current Level of Activity: as above  DME Available at Home:   PCP: Dr Loleta Chance  [facility] [place]       Phone:        Issues related to obtaining prescriptions: none  Coumadin follow up: no  Transportation at discharge: son  Smoker: no    Lawyer:        Name: Adian Jablonowski       Relationship to the patient: son       Phone: [phone]       Permission to contact:  yes  Name of POA/Guardian: na       Verified:   24/7 Supervision/Assistance Available at D/C if needed: na  Barriers/Significant Issues that may affect discharge or follow up care: none    Interior and spatial designer (Current/Prior): no       Facility:       Phone:        Fax:   Home Health/Home Infusion (Current/Prior): no       Home Health Company:        Phone:        Fax:   Non-Skilled Nurse, adult (Current/Prior): no       Agency:        Phone:        Fax: n  Oxygen/Bipap/CPAP/Hand Engineer, production Prior to admission:        Liter flow:        Provider Name:        Phone:        Fax:   Outpatient Dialysis Services: no       Scheduled days:         Dialysis Provider:        Phone:        Fax:     Other Pertinent Information   Met with patient and he denied any home going needs.    Discharge Plan   Met with patient to initiate discussion regarding discharge planning. Introduced self and role of case management/social work and provided Tour manager.    The discharge plan is to return home independently and denied any home going needs    CM/SW will continue to follow and remain available for continued discharge planning.    Patient/Family aware and taking part in the discharge plan.  Patient/family were offered a post-acute provider  list as applicable to the discharge plan and insurance provider.  Patient/family were given the freedom to choose providers and financial interest(s) were disclosed as appropriate.

## 2014-09-08 NOTE — Unmapped (Signed)
    Case Managment Discharge Summary     Patient name: Larry Pugh                                        Patient MRN: [id number]  DOB: 1945/07/31                              Age: 69 y.o.              Gender: male  Patient emergency contact: Extended Emergency Contact Information  Primary Emergency Contact: Karapetyan,Josh   [place]  Mobile Phone: [phone]  Relation: Son      Attending provider: Ezzard Standing, DO  Primary care physician: Horton Chin, MD    The MD has indicated that the patient is ready for discharge.  Larry Pugh is from home and no needs were identified.  The patient will be transported by family.    Transfer Mode/Level of Care: Family      The plan has been reviewed: yes     Patient/Family Informed of Discharge Plan: Yes    Plan Reviewed With Patient, Family, or Significant Other: Yes    Patient and or family are aware and in agreement with the discharge plan: Yes             Plan reviewed with MD and other members of the health care team: Yes  Care Plan Completed: Yes    No further CM needs.

## 2017-09-12 ENCOUNTER — Ambulatory Visit: Admit: 2017-09-12 | Discharge: 2017-09-12 | Payer: Medicare (Managed Care)

## 2017-09-12 DIAGNOSIS — K602 Anal fissure, unspecified: Secondary | ICD-10-CM

## 2017-09-12 NOTE — Unmapped (Signed)
Chief Complaint   Patient presents with   ??? New Patient Visit/ Consultation   ??? Rectal Pain        History of Present Illness  Larry Pugh is a 72 y.o. male who presents today for evaluation of anorectal pain and bleeding for the last several years. He also reports increasingly thin stool. His symptoms began essentially when he started taking Klonopin, which produces constipation. He used to have bowel movements daily, now will often go 3 or 4 days between bowel movements, and describes his stool as extremely hard. He underwent a colonoscopy last year with Dr. Read Drivers, and reports that he was instructed to take MiraLAX, but that he does not like taking medicine. He had polyps on this colonoscopy, and on prior colonoscopies, to the point that he was recommended to undergo genetic testing, but his insurance would not cover the cost. Of note, he has a history of an open colectomy 15 years ago by Dr. Rushie Chestnut. He denies any family history of colorectal pathology. He is a cyclist and cycles 80-100 miles per week. The pain is interfering with his ability to do that.       Review of Systems   Constitutional: Negative for activity change, appetite change, chills, diaphoresis, fatigue, fever, weight gain and weight loss.   HENT: Negative for congestion, dental problem, drooling, ear discharge, ear pain, facial swelling, hearing loss, mouth sores, nosebleeds, postnasal drip, rhinorrhea, sinus pressure, sneezing, sore throat, tinnitus, trouble swallowing and voice change.    Eyes: Negative for photophobia, pain, discharge, redness, itching and visual disturbance.   Respiratory: Negative for apnea, cough, choking, chest tightness, shortness of breath, wheezing and stridor.    Cardiovascular: Negative for chest pain, palpitations and leg swelling.   Gastrointestinal: Positive for anal bleeding, bloating, constipation and rectal pain. Negative for abdominal distention, abdominal pain, blood in stool, diarrhea, heartburn, nausea  and vomiting.   Genitourinary: Negative for decreased urine volume, difficulty urinating, discharge, dysuria, enuresis, flank pain, frequency, genital sores, hematuria, nocturia, penile pain, penile swelling, scrotal swelling, testicular pain and urgency.   Musculoskeletal: Negative for arthralgias, back pain, gait problem, joint swelling, myalgias, neck pain and neck stiffness.   Skin: Negative for color change, pallor, rash and wound.   Neurological: Negative for dizziness, tremors, seizures, syncope, facial asymmetry, speech difficulty, weakness, light-headedness, numbness and headaches.   Hematological: Negative for adenopathy. Does not bruise/bleed easily.   Psychiatric/Behavioral: Negative for agitation, behavioral problems, confusion, decreased concentration, depression, dysphoric mood, hallucinations, self-injury, sleep disturbance and suicidal ideas. The patient is not nervous/anxious and is not hyperactive.        Allergies  Adhesive tape-silicones; Other; and Metoprolol tartrate    Medications  Outpatient Encounter Prescriptions as of 09/12/2017   Medication Sig Dispense Refill   ??? budesonide-formoterol (SYMBICORT) 160-4.5 mcg/actuation inhaler Inhale 2 puffs into the lungs 2 times a day.     ??? clonazePAM (KLONOPIN) 1 MG tablet Take 1.5 mg by mouth daily.     ??? fluticasone (FLONASE) 50 mcg/actuation nasal spray Use 2 sprays into each nostril daily.     ??? multivitamin (THERAGRAN) tablet Take 1 tablet by mouth daily.     ??? aspirin 81 MG EC tablet Take 81 mg by mouth daily.       No facility-administered encounter medications on file as of 09/12/2017.         Histories  He has a past medical history of Arthritis; Asthma; Diverticulitis; and Kidney stones.    He has  a past surgical history that includes Rotator cuff repair; Colonoscopy; Colon surgery (2013); acl; Appendectomy; Lithotripsy; Back surgery; Carpal tunnel release; Cardiac catheterization; and Shoulder surgery.    His family history is not on  file.    He reports that he quit smoking about 39 years ago. His smoking use included Cigarettes. He has a 40.00 pack-year smoking history. He has never used smokeless tobacco. He reports that he drinks about 1.8 oz of alcohol per week . He reports that he does not use drugs.    The following portions of the patient's history were reviewed and updated as appropriate: allergies, current medications, past family history, past medical history, past social history, past surgical history, problem list and review of systems.    Blood pressure 140/82, pulse 76, height 6' 2 (1.88 m), weight 215 lb (97.5 kg), SpO2 97 %.  Physical Exam   Nursing note and vitals reviewed.  Constitutional: He is oriented to person, place, and time. He appears well-developed and well-nourished.   HENT:   Head: Normocephalic and atraumatic.   Right Ear: External ear normal.   Left Ear: External ear normal.   Nose: Nose normal.   Mouth/Throat: Oropharynx is clear and moist.   Eyes: Pupils are equal, round, and reactive to light. Conjunctivae and EOM are normal.   Neck: Normal range of motion. Neck supple.   Cardiovascular: Normal rate, regular rhythm and normal heart sounds.    Pulmonary/Chest: Effort normal and breath sounds normal.   Abdominal: Soft. Bowel sounds are normal. He exhibits no distension and no mass. There is no tenderness. There is no rebound and no guarding.   Genitourinary:   Genitourinary Comments: External inspection of the perianal area demonstrates a small anal skin tag just to the left of the posterior midline. No evidence of enlarged external hemorrhoids. The fissure is not readily evident externally.    Digital rectal exam is moderately painful for the patient. No palpable masses.    Anoscopy demonstrates a posterior fissure just to the left of midline. This is exquisitely tender.   Musculoskeletal: Normal range of motion.   Neurological: He is alert and oriented to person, place, and time.   Skin: Skin is warm and dry.    Psychiatric: He has a normal mood and affect. His behavior is normal. Judgment and thought content normal.          Assessment  Posterior midline anal fissure with associated skin tag. The etiology and natural history of anal fissures, as well as the treatment algorithm thereof, were discussed with the patient. I have explained that the most important element is controlling the constipation and hard bowel movements, and that this will likely require some form of supplement.    Plan   I have explained the details of a high-fiber diet and suggested that he try consuming 30-50 g of fiber per day. I have also prescribed nifedipine topical ointment, to be used 3 times daily. If he is not able to achieve soft bowel movements with diet alone, he is to take Metamucil, Konsyl, and/or MiraLAX. He is to follow-up with me in 6 weeks. If he has not had resolution of his symptoms at that time, I would recommend that we proceed with lateral internal sphincterotomy.    This document was dictated using Conservation officer, historic buildings.  While every effort is made to proofread the document as it is being created, please excuse any transcription errors encountered in reading.  Medical Decision Making  The following items were considered in medical decision making:  Discussion of patient care with other providers  Obtain records and history from outside facility/provider  Reviewed outside records    Referring MD: Bebe Shaggy, MD    Patient Care Team:  Beulah Gandy, MD as PCP - General (Internal Medicine)  Beulah Gandy, MD (Internal Medicine)  Danton Clap, MD (Cardiology Interventional)  Tommie Ard, MD as Consulting Physician (Internal Medicine)

## 2017-10-09 NOTE — Telephone Encounter (Signed)
Patient called back and confirmed 7/1 appointment.

## 2017-10-09 NOTE — Telephone Encounter (Signed)
LMOM to confirm appointment for 10/16/2017.  LMOM for patient to arrive 10 mins early to complete any paperwork.  LMOM to return call to office with any appt issues.  LMOM for patient to return call to confirm OV appt.

## 2017-10-16 ENCOUNTER — Ambulatory Visit: Admit: 2017-10-16 | Discharge: 2017-10-16 | Payer: Medicare (Managed Care) | Attending: Neurology

## 2017-10-16 DIAGNOSIS — G4733 Obstructive sleep apnea (adult) (pediatric): Secondary | ICD-10-CM
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
## 2017-10-16 NOTE — Unmapped (Addendum)
Remember to use the sleep apnea device at all times when sleeping.      Other tips to improve sleep  1.  Maintain a consistent sleep schedule.  Develop a bedtime routine to prepare yourself for sleep.    2.  Eliminate naps during the day.  3.  No caffeine consumption after 2pm.  4.  Keep the bedroom for sleep only.  5.  If unable to fall asleep or stay asleep within 20 minutes, go into another room and perform a relaxing activity.    6.  Read the book Say Good Night to Insomnia by Gregg Jacobs, PhD.    Safety precautions  Safety measures are recommended to limit the potential for injury.  Precautions include locking doors and windows, covering of windows with drapes or blinds, alarming bedroom and exit doors, limiting access to stairwells, stowing/securing of firearms and other weapons, and padding the furniture.      Sleep Apnea   Sleep apnea is a sleep disorder characterized by abnormal pauses in breathing while you sleep. When your breathing pauses, the level of oxygen in your blood decreases. This causes you to move out of deep sleep and into light sleep. As a result, your quality of sleep is poor, and the system that carries your blood throughout your body (cardiovascular system) experiences stress. If sleep apnea remains untreated, the following conditions can develop:  ?? High blood pressure (hypertension).  ?? Coronary artery disease.  ?? Inability to achieve or maintain an erection (impotence).  ?? Impairment of your thought process (cognitive dysfunction).  There are three types of sleep apnea:  1. Obstructive sleep apnea Pauses in breathing during sleep because of a blocked airway.  2. Central sleep apnea Pauses in breathing during sleep because the area of the brain that controls your breathing does not send the correct signals to the muscles that control breathing.  3. Mixed sleep apnea A combination of both obstructive and central sleep apnea.  RISK FACTORS  The following risk factors can increase your  risk of developing sleep apnea:  ?? Being overweight.  ?? Smoking.  ?? Having narrow passages in your nose and throat.  ?? Being of older age.  ?? Being male.  ?? Alcohol use.  ?? Sedative and tranquilizer use.  ?? Ethnicity. Among individuals younger than 35 years, African Americans are at increased risk of sleep apnea.  SYMPTOMS   ?? Difficulty staying asleep.  ?? Daytime sleepiness and fatigue.  ?? Loss of energy.  ?? Irritability.  ?? Loud, heavy snoring.  ?? Morning headaches.  ?? Trouble concentrating.  ?? Forgetfulness.  ?? Decreased interest in sex.  DIAGNOSIS   In order to diagnose sleep apnea, your caregiver will perform a physical examination. Your caregiver may suggest that you take a home sleep test. Your caregiver may also recommend that you spend the night in a sleep lab. In the sleep lab, several monitors record information about your heart, lungs, and brain while you sleep. Your leg and arm movements and blood oxygen level are also recorded.  TREATMENT  The following actions may help to resolve mild sleep apnea:  ?? Sleeping on your side. ??  ?? Using a decongestant if you have nasal congestion. ??  ?? Avoiding the use of depressants, including alcohol, sedatives, and narcotics. ??  ?? Losing weight and modifying your diet if you are overweight.  There also are devices and treatments to help open your airway:  ?? Oral appliances. These are custom-made mouthpieces   that shift your lower jaw forward and slightly open your bite. This opens your airway.  ?? Devices that create positive airway pressure. This positive pressure splints your airway open to help you breathe better during sleep. The following devices create positive airway pressure:  ?? Continuous positive airway pressure (CPAP) device. The CPAP device creates a continuous level of air pressure with an air pump. The air is delivered to your airway through a mask while you sleep. This continuous pressure keeps your airway open.  ?? Nasal expiratory positive airway  pressure (EPAP) device. The EPAP device creates positive air pressure as you exhale. The device consists of single-use valves, which are inserted into each nostril and held in place by adhesive. The valves create very little resistance when you inhale but create much more resistance when you exhale. That increased resistance creates the positive airway pressure. This positive pressure while you exhale keeps your airway open, making it easier to breath when you inhale again.  ?? Bilevel positive airway pressure (BPAP) device. The BPAP device is used mainly in patients with central sleep apnea. This device is similar to the CPAP device because it also uses an air pump to deliver continuous air pressure through a mask. However, with the BPAP machine, the pressure is set at two different levels. The pressure when you exhale is lower than the pressure when you inhale.  ?? Surgery. Typically, surgery is only done if you cannot comply with less invasive treatments or if the less invasive treatments do not improve your condition. Surgery involves removing excess tissue in your airway to create a wider passage way.  Document Released: 03/25/2002 Document Revised: 10/04/2011 Document Reviewed: 08/11/2011  ExitCare?? Patient Information ??2014 [employer].      PREPARING TO USE POSITIVE AIRWAY PRESSURE DEVICE AT HOME    Setting up your equipment (To be used at all times when sleeping, including naps)  ?? Place your machine on a hard, level surface close to where you sleep. Plug your machine into a grounded outlet. If you need to use an extension cord, we suggest you use a surge protector.   ?? Connect your hose to the airflow outlet and connect the other end of the hose to your mask.   ?? Using distilled water only, fill the water chamber. Do NOT fill above the maximum line.   ?? Replace the distilled water daily, as bacteria may start to grow.   ?? If you have nasal dryness or a dry mouth, increase the humidity level one increment  at a time.  ?? If your sleeping environment is cool, you can place the PAP hose under your covers to prevent condensation in the tubing.   ?? INSTRUCTIONS ON HOW TO PUT ON YOUR MASK: Place your mask on your face loosely. Most patients make the mistake of putting their masks on too tight.    o Turn on your machine. Lay back on your pillow to adjust your mask, as it will probably fit differently when you sit up vs. lying down. Remember, start very loose, and tighten as needed. A loose mask is much better than a tight mask. You will feel some air from the exhalation port on the mask. This is normal.   ?? Please note that if you are given a mask during your overnight sleep study or by your DME (medical supply company) you have 30 days to make any changes or exchanges. After the 30-day period, you may not be able to return the   mask or make changes for up to 3 months or longer depending on your insurance. If you have any questions please contact your DME (medical supply company).      We hope you do well adjusting to using your PAP device. PAP therapy is the gold standard treatment for Obstructive Sleep Apnea.     Few features we would like to highlight on your PAP device.       The first thing we want to point out is the RAMP feature. If you need temporary relief from the full therapy pressure of your machine, you can press the ramp button.  Respironics device: Press the elongated triangle button once.   F&P device: Press and hold the large dial for 3 seconds.    ResMed device: Ramp feature will automatically activate when you place the mask on your face (this is a built in feature). To reactivate it just pull the mask away from you face and reposition it. Ramp feature will reactivate.     The Ramp feature temporarily allows you to reduce the pressure on your machine. Your machine can be set to ramp up within 5-45 minutes to your full therapy pressure. If you are not asleep by the time your machine reaches full pressure,  and you need relief, you can re-start the ramping process by pressing the button again. You can use the ramp feature as many times and as often as you like. This is a useful feature if you use the restroom in the middle of the night.     The next thing we would like to point out is the HUMIDIFICATION feature on your PAP device. Most machines are pre-set for a humidity setting of 3 or 4. You can manually change the humidifier settings on your machine when you turn the machine on. Increase the humidity level one increment at a time.  Respironics device: Turn the on/off button to the left for less humidification or the right for more humidification. Humidification ranges from 1-5. If you have a heated hose talk to your provider or DME who can teach you how to make adjustments.   F&P device: Turn the large dial to the right. Humidification ranges from 1-7.   ResMed device: Attach the humidifier component to the main unit, turn   the dial and choose the water droplet icon; press the button once. Then turn the dial to adjust your humidity setting. Press button a final time to save the setting. Humidification ranges from 1-8 (depending on device model). The newer device has an Auto humidification feature, if you have a heated hose talk to your provider or DME to further assistance.     For example, if you are waking up with a dry mouth, you should turn your humidifier up one number at a time. If you are getting excess condensation in your mask or tubing, you should turn the humidifier down one number at a time.      A prescription will be sent to a DME (medical supply company) covered by your insurance. The DME Company should contact you within 7-10 business days once they receive the prescription. If you have not received a call from the DME Company regarding your device, PLEASE CALL US so we may investigate further.     Once set up with your PAP equipment, please call our office and set up an appointment with your sleep  provider. At that visit, we will obtain a download from your machine, address any issues with   the mask or device, review cleaning instructions, replacement supply schedule and answer any questions you may have.     After receiving your PAP device please remember you need to bring all your equipment to EVERY office visit including your machine, tubing, mask and power cord. Make sure you do not travel with water in the humidifier (always empty it every morning). A thorough check of all your equipment will take place at each appointment.     CLEANING INSTRUCTIONS FOR PAP EQUIPMENT     Keeping your equipment and supplies clean is very important.     REMINDER: Only use DISTILLED WATER in your humidifier, Empty water daily.    DAILY  Mask and tubing:   ?? Wash your face before applying mask  ?? Wash mask and tubing with baby shampoo and warm water.     Humidifier:   ?? Empty water in reservoir  ?? Clean with baby shampoo and warm water  ?? Rinse, then air dry    WEEKLY  Mask and tubing:   ?? Soak your mask and tubing in 1 part vinegar and 3 parts water for 30 minutes. Rinse, and allow to air dry.     Humidifier:   ?? Wash with warm water and baby shampoo  ?? Soak in 1 part vinegar and 3 parts water for 30 minutes  ?? Rinse with warm water and allow to air dry    Machine Exterior:   ?? Wipe with a clean damp cloth    MONTHLY AND/OR AS NEEDED  ?? Reusable foam filters (black filter)- wash in warm water with baby shampoo. Rinse well and dry with paper towel  ?? Disposable felt filter (white filter)- Replace filter every two weeks to once a month    NOTE: If you are having repeated sinus and /or respiratory infections, dirty equipment may be the cause. It may help to clean and disinfect your equipment more frequently    TRAVELING  Always make sure the humidifier is empty when traveling. (including doctor appointments, air travel or long distance driving).  When flying, always carry your PAP device with you as a carry on item, NEVER check  it in as baggage. We can provide you with a letter stating it is a medical device, talk to your provider.    Always make sure you have your mask and tubing with you. You will need appropriate plug adapters when traveling outside the [place].    If travelling by air and you are unable to carry distilled water with youuse bottled water (no more than 2 weeks). DO NOT USE TAP WATER.     Equipment Replacement Schedule    To get the most benefit from your PAP therapy, your equipment should be replaced when necessary based on wear and tear. For example, your mask may need to be replaced if you notice it is cracked or the seal is leaking. If your tubing is torn, it needs to be replaced.   If you equipment is showing signs of wear, you may be entitled to replace it. The replacement schedule for Medicare patients is shown below. If you are NOT a Medicare patient, please check with your DME (Durable Medical Supply) provider for your individual insurance policy???s replacement schedule.     Supplies   Medicare Medicaid My Insurance Plan  Mask    1 per 3 m 1 per year _______________  Nasal cushion   2 per m 1 per 6 m _______________  Pillows cushion    2 per m 1per 6 m _______________  Full-face cushion  1 per m 1per 6 m _______________  Headgear   1 per 6 m 1 per year _______________  Water Chamber  1 per 6 m N/A  _______________  Chinstrap   1 per 6 m 1 per 6 m       _______________  Tubing/Hose   1 per 3 m 1 per year _______________  Heater wire tubing  1 per 3 m N/A  _______________  Filter, disposable (white) 2 per m 1 per m _______________  Filter, particle foam (black) 1 per 6 m 4 per year _______________  Therapy device  1 per 5 yrs  ---  _______________    Follow up and compliance goals for Medicare and Medicaid patients   (most private insurances are now following similar rules)  Medicare and Medicaid require patients to follow-up with their provider after they are set up with their PAP equipment. The follow up  appointment should be within 31-90 days after receiving their equipment. If patient does not follow up as directed there might be issues with the cost of the device being covered by the insurance. Therefore please ensure you make a follow up appointment with your sleep provider.      Insurance also requires that the patient meet some compliance goals such as the download should indicate patient has been wearing the device for at least 4 hours per night AND they are using the device for 30 nights in a row.     Key points to remember  ?? You should wear the device every time you sleep, no matter when you sleep or where you sleep. When you nap or travel or go on vacation you need to use the machine when asleep.   ?? If you sleep without the device your Obstructive Sleep Apnea (OSA) is untreated and your risk of heart attacks and strokes are higher.   ?? Clean the device on a regularly basis to avoid sinus infections and pneumonias   ?? Never travel with water in the reservoir   ?? Replace supplies on a regularly basis (see schedule below). You may need to contact your Durable Medical Equipment company (DME) and request supplies (just like you would medications from a pharmacy).  ?? Alcohol will worsen OSA, Alcohol can decrease your drive to breathe, slowing your breathing and making your breaths shallow. In addition, it may relax the muscles of your throat, which may make it more likely for your upper airway to collapse. REMEMBER TO ALWAYS WEAR YOUR PAP DEVICE WHEN SLEEPING.   ?? Certain medications can also worsen OSA such as some pain medications, anti-anxiety medications, muscle relaxants, testosterone supplementation. Please inform your sleep provider which medications you take on a regular basis.   ?? All patients are advised to make follow up appointments with the provider after they are set up with their PAP device. Most insurance plans now require the patient set up a follow up appointment with the provider 31-90 days  after they have been set up with their device and at least once a year thereafter. Discuss these issues with your provider for more details.   ?? Please note when you are provided with a mask you have a manufacturer 30 day guarantee that comes with it. If you do not like that mask for any reason contact your DME and request an exchange. After 30 days you may not be eligible for a new mask for another 3-6 months depending   on insurance. Talk to your DME or sleep provider for more details.   ?? If you are struggling with nasal congestion try to use a nasal sinus rinse available over the counter such as Netti Pot 1 hour before bedtime (you may have to use it daily for a 7-10 days then as needed). You can also try over the counter Flonase or Nasacort 1-2 sprays in each nostril at bedtime.   ?? If you ever have problems with the device or the plan discussed with your sleep provider has not come to fruition please contact your provider by phone or via MyChart. Thank you.    Drowsy Driving Tips  These suggestions will help prevent you from the risk of drowsy driving.     1. If you feel tired or drowsy don't drive. Sleepiness is a major cause of motor vehicle accidents and accounts for 40% of all fatal crashes reported on the [place] [address]. No matter how much you think you can control sleepiness, you can't.   2. Ensure you follow your doctor???s advice about the treatment for your sleep disorder. For example, if you have sleep apnea and use CPAP, ensure you use it fully the night before your trip.   3. Get a good night's sleep before driving. Do not cut yourself short of sleep if you plan a long drive the next day. Get to bed early and do not stay up late packing.   4. Avoid alcohol both the night before your trip and during your trip. Alcohol will disrupt sleep and make you more tired the next day. Sleepiness and alcohol are additive in increasing impairment of your driving ability.   5. Avoid any sedative medications, including  sedative antihistamines that are often contained in cold or allergy medications, the night before you drive as they may have long lasting effects the next day.  6. Travel during non-sleeping hours. Accidents due to sleepiness are more common during the nighttime hours.   7. If sleepy, stop and rest. Drink coffee, walk around or have a brief nap in your car if you are sleepy. Have a 10 -15 minute break after every 2 hours of driving.   8. Drive with a companion. Share the driving. Relax in the back seat until it is your time to share the driving again.

## 2017-10-23 NOTE — Unmapped (Signed)
Spoke to [place], insurance denied titration study but approved APAP. #[id number]. Valid from 7/8-10/5.

## 2017-10-23 NOTE — Telephone Encounter (Signed)
spoke to patient verbalized understanding

## 2017-10-23 NOTE — Telephone Encounter (Signed)
Message sent to [facility] for new pap set up sleep study available under the card/pulm tab

## 2017-10-23 NOTE — Telephone Encounter (Signed)
APAP ordered.  Please let the patient know that his insurance denied the sleep study.  We will see if treating his obstructive sleep apnea with auto-CPAP assists with managing his unusual behaviors at night.  He can continue Klonopin and Melatonin as prescribed by his neurologist, Dr. Yevonne Aline.

## 2017-10-24 ENCOUNTER — Ambulatory Visit: Admit: 2017-10-24 | Discharge: 2017-10-24 | Payer: Medicare (Managed Care)

## 2017-10-24 DIAGNOSIS — K602 Anal fissure, unspecified: Secondary | ICD-10-CM

## 2017-10-24 NOTE — Progress Notes (Signed)
Chief Complaint   Patient presents with    Follow-up    Anal Fissure        History of Present Illness  Larry Pugh is here today for follow-up on his posterior anal fissure. He states that he has been using the topical nifedipine ointment faithfully and has been taking Metamucil and has increased his dietary intake of fruits and vegetables. He reports 100 percent resolution in the anorectal pain and bleeding and is very pleased with his progress. He states that he is still dealing with intermittently firm stool, typically having bowel movements every other day at this point. He is requesting whether or not he can return to cycling, citing some radicular symptoms in his posterior left thigh with involuntary twitching of his calf.       Review of Systems   Constitutional: Positive for fatigue. Negative for activity change, appetite change, chills, diaphoresis and fever.   Respiratory: Negative for apnea, cough, choking, chest tightness, shortness of breath, wheezing and stridor.    Cardiovascular: Negative for chest pain, palpitations and leg swelling.   Gastrointestinal: Positive for constipation. Negative for abdominal distention, abdominal pain, anal bleeding, bloating, blood in stool, diarrhea, heartburn, nausea, rectal pain and vomiting.   Neurological: Positive for dizziness and light-headedness. Negative for weakness.       Allergies  Adhesive tape-silicones; Other; and Metoprolol tartrate    Medications  Outpatient Encounter Prescriptions as of 10/24/2017   Medication Sig Dispense Refill    aspirin 81 MG EC tablet Take 81 mg by mouth daily.      budesonide-formoterol (SYMBICORT) 160-4.5 mcg/actuation inhaler Inhale 2 puffs into the lungs 2 times a day.      clonazePAM (KLONOPIN) 1 MG tablet Take 1.5 mg by mouth daily.      fluticasone (FLONASE) 50 mcg/actuation nasal spray Use 2 sprays into each nostril daily.      melatonin 5 mg Tab Take 5 mg by mouth.      multivitamin (THERAGRAN) tablet Take 1  tablet by mouth daily.       No facility-administered encounter medications on file as of 10/24/2017.         Histories  He has a past medical history of Arthritis; Asthma; Diverticulitis; and Kidney stones.    He has a past surgical history that includes Rotator cuff repair; Colonoscopy; Colon surgery (2013); acl; Appendectomy; Lithotripsy; Back surgery; Carpal tunnel release; Cardiac catheterization; and Shoulder surgery.    His family history is not on file.    He reports that he quit smoking about 39 years ago. His smoking use included Cigarettes. He has a 40.00 pack-year smoking history. He has never used smokeless tobacco. He reports that he drinks about 1.8 oz of alcohol per week . He reports that he does not use drugs.    The following portions of the patient's history were reviewed and updated as appropriate: allergies, current medications, past family history, past medical history, past social history, past surgical history and problem list and review of systems.    Height 6' 2 (1.88 m).  Physical Exam   Nursing note and vitals reviewed.  Constitutional: He is oriented to person, place, and time. He appears well-developed and well-nourished.   HENT:   Head: Normocephalic and atraumatic.   Right Ear: External ear normal.   Left Ear: External ear normal.   Nose: Nose normal.   Mouth/Throat: Oropharynx is clear and moist.   Eyes: Pupils are equal, round, and reactive to light. Conjunctivae  and EOM are normal.   Neck: Normal range of motion. Neck supple.   Cardiovascular: Normal rate, regular rhythm and normal heart sounds.    Pulmonary/Chest: Effort normal and breath sounds normal.   Abdominal: Soft. Bowel sounds are normal. He exhibits no distension and no mass. There is no tenderness. There is no rebound and no guarding.   Musculoskeletal: Normal range of motion.   Neurological: He is alert and oriented to person, place, and time.   Skin: Skin is warm and dry.   Psychiatric: He has a normal mood and affect.  His behavior is normal. Judgment and thought content normal.          Assessment  Posterior anal fissure, with complete clinical resolution of his symptoms following a course of topical nifedipine and fiber supplementation. As he has no more symptoms from the fissure, there is no reason to consider surgical intervention at this point. As it relates to his radicular symptoms, I have suggested that this may have more to do with his history of prior lumbar surgery or some subsequent musculoskeletal disorder. I suggested that he speak with his primary care physician about this and consider obtaining an MRI. He has declined an offer for an MRI to be ordered at this time.    Plan  Have suggested that he try adding a capful of MiraLAX to his daily regimen of Metamucil, and order to further soften his stool and mitigate the risk for recurrent fissure symptoms. Again, for evaluation of his radicular symptoms, I have suggested that he follow-up with his primary care physician or spine physician. He is welcome to follow-up with me at anytime as needed.    This document was dictated using Conservation officer, historic buildings.  While every effort is made to proofread the document as it is being created, please excuse any transcription errors encountered in reading.         Medical Decision Making  The following items were considered in medical decision making:  Discussion of patient care with other providers  Obtain records and history from outside facility/provider  Reviewed outside records    Referring MD: Bebe Shaggy, MD    Patient Care Team:  Beulah Gandy, MD as PCP - General (Internal Medicine)  Beulah Gandy, MD (Internal Medicine)  Danton Clap, MD (Cardiology Interventional)  Tommie Ard, MD as Consulting Physician (Internal Medicine)

## 2018-01-16 NOTE — Unmapped (Signed)
Patient is calling to advise that over the weekend her suffered from a night terror. States that he hit his head, had a Concussion and two black eyes along, cut forehead and had to visit his PCP on Monday. Advised that he had some imaging done in which everything came back normal. States that this is the 3rd time this have happened ending up with a Concussion   and was advised to reach out to [facility]. Patient can be reached regarding this matter at [phone] (home)

## 2018-01-17 NOTE — Unmapped (Signed)
Untreated obstructive sleep apnea may be contributing, though he should have been set-up with auto-CPAP, which was ordered on 10/23/2017.  Please provide me with a compliance download for review.  Please also check with the patient to see which medications he is taking - I had advised that he can continue taking Klonopin and Melatonin as prescribed by his neurologist.

## 2018-01-19 NOTE — Telephone Encounter (Signed)
LM

## 2018-01-23 NOTE — Telephone Encounter (Signed)
LM with my direct line.

## 2018-01-25 NOTE — Telephone Encounter (Signed)
Patient transferred to my cell phone yesterday. Spoke to him about Dr [employer] message. He is currently on 10mg  of melatonin but feels sleepy during the day. I did ask patient about CPAP and he does not want to try it. Also asked patient about having episodes on Melatonin and he stated he has not had any episodes when he has been on the melatonin but he is so sleepy during the day. Told patient I would call him back on Thursday when I returned to the office and could review all notes.

## 2018-01-30 NOTE — Telephone Encounter (Signed)
Spoke to Dr Marry Guan and she would really like patient to try PAP therapy. Patient can discuss with her at the upcoming appointment

## 2018-01-31 NOTE — Unmapped (Signed)
LMOM to confirm appointment for 02/05/2018. LMOM for patient to arrive 10 mins early to complete any paperwork. LMOM for patient to bring all PAP equipment & supplies. LMOM for patient if they arrive 10 mins late for appt, it will be rescheduled. LMOM to return call to office with any appt issues. LMOM for patient to return call to confirm OV appt.

## 2018-02-05 ENCOUNTER — Ambulatory Visit: Admit: 2018-02-05 | Discharge: 2018-02-05 | Payer: Medicare (Managed Care) | Attending: Neurology

## 2018-02-05 DIAGNOSIS — G4750 Parasomnia, unspecified: Secondary | ICD-10-CM

## 2018-02-05 NOTE — Unmapped (Signed)
NAME: Larry Pugh  MRN: [id number]  AGE: 72 y.o.               DOB: 07-02-1945  PRIMARY CARE PROVIDER: Janine Limbo, MD  REFERRING PROVIDER: Beulah Gandy     Referred by Dr. Tommie Ard for an evaluation of sleep issues.      CC:   Chief Complaint   Patient presents with   ??? Sleep Issues     Last seen on 10/16/17.  Portions of this note have been copied forward but reviewed in their entirety and are accurate to date.  Sections in the note were updated where appropriate.       Interval history, 02/05/18:  Parsomnias  [name] says today that he is not doing well. He has had three concussions from parasomnias (what he calls night terrors)  in the last year. The most recent one was 01/13/18. For this most recent concussion on 01/13/18, he had gone to visit his cousin in [place]. He drank 3-4 beers, which is atypical for him. He found himself on the floor with a gash on his forehead and two black eyes. He says he is just now getting over this concussion, in terms of sleepiness and cognitive issues. With each concussion, the recovery is longer and he has problems thinking. He reports that his PCP did a CT scan following this concussion that was reportedly normal.    In terms of parasomnia frequency, he is unsure, but estimates less than once weekly. Frequency decreased since starting klonopin. For a while, he would set his alarm for 2:30 am to break my sleep cycle and this would seem to work, for a while. But he has stopped doing that.    In terms of safety measures, he has changed bedrooms to a bedroom that is larger but with smaller furniture. He hs not padded his furniture or removed any particular objects from the room. He has once or twice found himself walking into another room during the events.    PTSD  Was in [place] for 20 months. He suspects that PTSD could be contributing to his parasomnias. Three years ago, he saw a PTSD expert through the [place], who reportedly doubted that he could have been  exposed to the things he described, saying They don't do those kinds of things in the [employer]. [name] was very discouraged by this.  He also reports that he saw a psychiatrist and a psychologist at the [place] a few times about a year ago, and he found that very disappointing because he was not officially diagnosed with PTSD. He was advised to attend group sessions at the [facility], which is a 50 mile drive one way for him. He says that further individual counseling was not offered. After his most recent concussion, [name] says that his usual PCP was out of town, so he saw one of his colleagues. That physician recommended psychiatric counseling to [name], but he says no referral was made.     Sleep apnea  Was diagnosed with obstructive sleep apnea by Dr. Mena Goes at the [facility], but did not start treatment. Is willing to start treatment as this point, because he is so desperate to avoid the parasomnias.    Migraine  He says that in August, he had 3 concerning stroke like episodes with a span of two weeks. These occurred during the daytime in which he thought he was having a stroke. He has ocular migraines  for years. These recent events would start out like an ocular migraine, with a change to his vision (binocular), but then much worse, with my eyes crossing and a feeling that he was going to lose his vision and possibly pass out. Duration 15-20 minutes. He reports that Dr. Yevonne Aline has done a brain MRI in 2017, and then again after these events, both reportedly normal. He says that Dr. Yevonne Aline also wanted an MRA in August, but this was not approved by [employer]. He says that Dr. Yevonne Aline does not want to see me anymore.    Other symptoms   Sometimes drags right foot when he walks.     Daily function  He is still working a 5-6 hour shift at [employer].    Current medication doses:   Klonopin:  1 mg qHS (he is afraid to go bed without it)  Melatonin:  5 mg qHS (down from 10 mg, which made  him too drowsy - looking to cut it to 2.5 mg, as I do not like taking medicine)  ------------------------------------------------------------------------------    Past medical history:  Tremors, cardiac arrhythmias, mitral regurgitation, overweight, PTSD. 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  Occasional orthostatic lightheadedness.  Bedpartner:  None currently.      Medications to assist for sleep:  Started on Klonopin by Dr. Valene Bors at one mg at bedtime three years ago. The Klonopin 1 mg dose seems to be the best for him.  At 1 1/2 mg states that he had suicidal thoughts. Is Melatonin 5 mg at bedtime with Klonopin, which has been helpful and has been on it for the past three months.  Without Melatonin, he still had a couple of events.      Schedule:  Bedtime is 11-11:30pm during the week and on the weekends.  Falls asleep in a few minutes, with one or two nighttime awakenings.  Falls back asleep in 5-10 minutes.  Awakens at  6-7am during the week and  on the weekends.  Takes daily intentional and unintentional naps, lasting 2-3 hours. No TV in his bedroom; he reads for 20-30 minutes in his bed before falling asleep.    Sleep symptoms    Sleep-disordered breathing:     Yes Snoring   Yes Witnessed apneas    Parasomnias:    Yes Dream enactment behavior   Yes Sleepwalking, last episode was a couple of years ago   Unknown Sleep-talking    ESS: 7    Caffeine:   3 cups of coffee a day   Latest use:  3:30-4pm  Alcohol:  Quit drinking completely after his last concussion on 9/28 to eliminate things that could be causes.  Drugs:  None  Smoking: Quit 39 years ago    Driving while drowsy:  No     Family history:     Sister with dementia and seizures.     History of tonsillectomy:  None.      Family Hx:   History reviewed. No pertinent family history.    Past Medical Hx:   Past Medical History:   Diagnosis Date   ??? Arthritis    ??? Asthma    ??? Diverticulitis    ??? Kidney stones        Problem List:  Patient Active Problem List   Diagnosis   ???  Acute sinusitis   ??? Asthma   ??? Abnormal involuntary movement   ??? Chest pain at rest   ??? Anal fissure       SurgHx:  Past Surgical History:   Procedure Laterality Date   ??? acl     ??? APPENDECTOMY     ??? BACK SURGERY     ??? CARDIAC CATHETERIZATION     ??? CARPAL TUNNEL RELEASE     ??? COLON SURGERY  2013    [facility]/ colon resection   ??? COLONOSCOPY     ??? LITHOTRIPSY     ??? ROTATOR CUFF REPAIR     ??? SHOULDER SURGERY         Social Hx:   Patient  reports that he quit smoking about 39 years ago. His smoking use included cigarettes. He has a 40.00 pack-year smoking history. He has never used smokeless tobacco. He reports current alcohol use of about 3.0 standard drinks of alcohol per week. He reports that he does not use drugs.    Allergies  Allergies as of 02/05/2018 - Fully Reviewed 02/05/2018   Allergen Reaction Noted   ??? Adhesive tape-silicones Dermatitis and Rash 07/07/2011   ??? Other Dermatitis 07/07/2011   ??? Metoprolol tartrate Other (See Comments) 03/19/2015       CURRENT MEDICATIONS:   Does NOT take aspirin  Flonase and budesonide are PRN  Klonopin dose is 1 mg    Current Outpatient Medications   Medication Sig   ??? budesonide-formoterol Inhale 2 puffs into the lungs 2 times a day.   ??? clonazePAM Take 1.5 mg by mouth daily.   ??? melatonin Take 5 mg by mouth.   ??? multivitamin Take 1 tablet by mouth daily.   ??? aspirin Take 81 mg by mouth daily.   ??? fluticasone propionate Use 2 sprays into each nostril daily.     No current facility-administered medications for this visit.         Review of Systems   General ROS: positive for  - sleep disturbance  Eyes: positive for contacts/glasses and irritation, blurry vision, double vision, dry eyes, photophobia  Ears, nose, mouth, throat, and face: positive for nasal congestion  Respiratory: negative  Cardiovascular: negative  Gastrointestinal: negative  Genitourinary: negative  Integument/breast: negative  Hematologic/lymphatic: negative  Musculoskeletal: positive for neck  pain  Neurological: positive for post-concussive symptoms  Behavioral/Psych: positive for concentration difficulties and memory problems  Endocrine: negative  Allergy and Immunology: positive for - seasonal allergies     The following portions of the patient's history were reviewed and updated as appropriate:  allergies, current medications, past family history, past medical history, past social history, past surgical history, problem list  and review of systems      Objective:     OBJECTIVE :   Wt Readings from Last 3 Encounters:   02/05/18 213 lb 3.2 oz (96.7 kg)   10/24/17 208 lb (94.3 kg)   10/16/17 208 lb (94.3 kg)     BP 100/58    Pulse 68    Resp 16    Ht 6' 2.02 (1.88 m)    Wt 213 lb 3.2 oz (96.7 kg)    SpO2 96%    BMI 27.36 kg/m??    Physical Exam   Constitutional:  Alert and in no apparent distress. Well-developed and well-nourished.    Head:  Normocephalic and atraumatic.   Ears:  Appear normal externally bilaterally  Eyes:  Conjunctivae are normal. Pupils are equal, round, and reactive to light. No scleral icterus, drainage or redness.   Mouth: Friedman palate position class III.    Neck:  Supple. No tenderness.    Cardiovascular:  Normal  rate and regular rhythm.  No murmur.   Pulmonary/Chest:  Effort normal.  Breath sounds normal. No stridor. No respiratory distress, no wheezes, no rales.   Abdominal:  Soft. There is no tenderness.   Musculoskeletal:  Normal range of motion and strength. Normal gait. No edema and no tenderness.   Neurological:   Cranial nerves II-XII intact. Strength 5/5 in upper and lower extremities bilaterally.  Reflexes 2+ and symmetrical in bilateral UE and LE.  Light touch sensation intact in bilateral upper and lower extremities bilaterally.  No dysmetria on finger-to-nose testing.  Rapid-alternating movements within normal limits. Normal gait.  No tremor.  No rigidity.   Skin:  Warm and dry. No rash noted.   Psychiatric:  Normal speech. Mood is not good. Affect is anxious and  pessimistic. Behavior is normal. Judgment and thought content normal.     Labs Reviewed:   Lab Results   Component Value Date    IRON 79 04/04/2006    TIBC 329 04/04/2006     Lab Results   Component Value Date    WBC 6.4 09/08/2014    HGB 14.6 09/08/2014    HCT 43.0 09/08/2014    MCV 89.1 09/08/2014    PLT 148 09/08/2014     Lab Results   Component Value Date    GLUCOSE 100 09/08/2014    BUN 18 09/08/2014    CO2 26 09/08/2014    CREATININE 1.21 09/08/2014    K 4.3 09/08/2014    NA 139 09/08/2014    CL 107 09/08/2014    CALCIUM 8.9 09/08/2014     No results found for: HGBA1C  Lab Results   Component Value Date    TSH 2.32 09/07/2014       Imaging:  No results found for this or any previous visit.  No results found for this or any previous visit.  No results found for this or any previous visit.    Diagnostic Testing  Polysomnogram on 12/30/16 at [facility] showed an apnea-hypopnea index of 6.8 events per hour and an oxygen saturation nadir of 85%.  The periodic limb movement index (PLMI) with arousals was 0.2 events/hour of sleep.  No REM related events, but no supine R sleep.   Weight at time of study: 202 lbs.       Assessment:     1. OSA (obstructive sleep apnea)     2. Other headache syndrome  Amb referral to Neurology   3. Parasomnia, unspecified type  Amb referral to Psychology   4. Trauma and stressor-related disorder  Amb referral to Psychology       ELMA LIMAS is a 72 y.o. male with a history of  tremors, cardiac arrhythmias, mitral regurgitation, overweight, PTSD who was referred by Dr. 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     Polysomnogram at [facility] on 12/30/2016 showed obstructive sleep apnea but there was no documentation on whether REM sleep without atonia was present.    His dream enactment behavior has been better controlled with Klonopin 1 mg at bedtime and  Melatonin 5 mg at bedtime, prescribed by Dr. Valene Bors.  However, it is still quite problematic, and has caused him to have three concussions in the past year. It is unclear how much untreated obstructive sleep apnea may be contributing to symptoms, which may allow him to decrease his use of Klonopin. Today, he is willing to  try CPAP to treat his sleep apnea.  An auto-CPAP device is ordered.  If post-concussive symptoms persist, would consider a referral to the[facility] clinic.     The pathophysiology of obstructive sleep apnea was discussed, including the high risk for cerebrovascular or cardiovascular disease if obstructive sleep apnea is untreated.  Advised not to drive while drowsy, and tips to avoid drowsy driving were provided.     Safety precautions in the setting of parasomnias were discussed, including padding the bedroom furniture and removing dangerous objects from the room. Advised continued abstaining from alcohol.  Reviewed sleep hygiene, including no caffeine after 2pm.  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    For the ocular migraines with some features that are worse and that were concerning to the patient for a possible stroke (with reportedly negative brain MRI), will refer to Dr. Cheryln Manly at [facility].    For the PTSD, will refer to the Stress Clinic at [facility].      Plan:     As above.  Follow-up in three months.     Milus Banister, MD PhD  Child Neurology, PGY-5    Attending Note  This patient was seen and examined with Dr. Andee Lineman, child neurology resident.  I performed a history and physical examination of the patient and discussed the  management of this patient with the resident. I reviewed the resident???s note and agree with the documented findings and plan of care.    Returns today for a follow-up appointment.  On exam, he is alert, appropriately conversant.  Chest/lungs clear.   Heart sounds regular.      OSA: Very reluctant but willing to try CPAP.  Auto-CPAP has already been ordered.  The pathophysiology of obstructive sleep apnea was discussed, including the high risk for cerebrovascular or cardiovascular disease if obstructive sleep apnea is untreated.  Advised not to drive while drowsy, and tips to avoid drowsy driving were provided.    Parasomnias:  Also thinks that his parasomnias may be stress-related. Is on Klonopin and Melatonin, as directed by Dr. Yevonne Aline.  Episode associated with concussion was triggered by alcohol when visiting family in [place], but overall has decreased in frequency since starting Klonopin and Melatonin.   Has worked on decreasing alcohol usage. Is willing to see if he is a candidate for the [place] [facility] to process his stressors in the [employer].  Treating obstructive sleep apnea also may be helpful.  Safety precautions again discussed.    Headaches:  Has some migrainous features and post-concussive headache.  He is amenable to a referral to Dr. Jimmey Ralph in the headache clinic.     Follow-up in three months.       [name] Marion, MD  02/05/2018 5:09 PM         SLEEP QUALITY MEASURES:   Obstructive Sleep Apnea Follow Up Visit:   Adherence to therapy documented at least annually?: Yes    Reassessment of excessive daytime sleepiness?: Yes    Weight / body mass index assessment at visit?: Yes    Blood pressure assessed at visit? : Yes        Medical Decision Making:  The following items were considered in medical decision making:  Review / order clinical lab tests  Reviewed outside records    Risks & Benefits:  Risks, benefits and treatment options discussed with patient.

## 2018-02-05 NOTE — Unmapped (Signed)
Larry Pugh called in requesting to know address for upcoming appt today at 8:15am. Pt is currently on location, unable to locate building. Provided pt with the current information.     Larry Pugh can be reached at: [phone] (home)

## 2018-02-05 NOTE — Unmapped (Addendum)
Remember to use the sleep apnea device at all times when sleeping.      Other tips to improve sleep  1.  Maintain a consistent sleep schedule.  Develop a bedtime routine to prepare yourself for sleep.    2.  Eliminate naps during the day.  3.  No caffeine consumption after 2pm.  4.  Keep the bedroom for sleep only.  5.  If unable to fall asleep or stay asleep within 20 minutes, go into another room and perform a relaxing activity.    6.  Read the book Say Good Night to Insomnia by Leeann Must, PhD.    Safety precautions  Safety measures are recommended to limit the potential for injury.  Precautions include locking doors and windows, covering of windows with drapes or blinds, alarming bedroom and exit doors, limiting access to stairwells, stowing/securing of firearms and other weapons, and padding the furniture.      Sleep Apnea   Sleep apnea is a sleep disorder characterized by abnormal pauses in breathing while you sleep. When your breathing pauses, the level of oxygen in your blood decreases. This causes you to move out of deep sleep and into light sleep. As a result, your quality of sleep is poor, and the system that carries your blood throughout your body (cardiovascular system) experiences stress. If sleep apnea remains untreated, the following conditions can develop:  ?? High blood pressure (hypertension).  ?? Coronary artery disease.  ?? Inability to achieve or maintain an erection (impotence).  ?? Impairment of your thought process (cognitive dysfunction).  There are three types of sleep apnea:  1. Obstructive sleep apnea Pauses in breathing during sleep because of a blocked airway.  2. Central sleep apnea Pauses in breathing during sleep because the area of the brain that controls your breathing does not send the correct signals to the muscles that control breathing.  3. Mixed sleep apnea A combination of both obstructive and central sleep apnea.  RISK FACTORS  The following risk factors can increase your  risk of developing sleep apnea:  ?? Being overweight.  ?? Smoking.  ?? Having narrow passages in your nose and throat.  ?? Being of older age.  ?? Being male.  ?? Alcohol use.  ?? Sedative and tranquilizer use.  ?? Ethnicity. Among individuals younger than 35 years, African Americans are at increased risk of sleep apnea.  SYMPTOMS   ?? Difficulty staying asleep.  ?? Daytime sleepiness and fatigue.  ?? Loss of energy.  ?? Irritability.  ?? Loud, heavy snoring.  ?? Morning headaches.  ?? Trouble concentrating.  ?? Forgetfulness.  ?? Decreased interest in sex.  DIAGNOSIS   In order to diagnose sleep apnea, your caregiver will perform a physical examination. Your caregiver may suggest that you take a home sleep test. Your caregiver may also recommend that you spend the night in a sleep lab. In the sleep lab, several monitors record information about your heart, lungs, and brain while you sleep. Your leg and arm movements and blood oxygen level are also recorded.  TREATMENT  The following actions may help to resolve mild sleep apnea:  ?? Sleeping on your side. ??  ?? Using a decongestant if you have nasal congestion. ??  ?? Avoiding the use of depressants, including alcohol, sedatives, and narcotics. ??  ?? Losing weight and modifying your diet if you are overweight.  There also are devices and treatments to help open your airway:  ?? Oral appliances. These are custom-made mouthpieces  that shift your lower jaw forward and slightly open your bite. This opens your airway.  ?? Devices that create positive airway pressure. This positive pressure splints your airway open to help you breathe better during sleep. The following devices create positive airway pressure:  ?? Continuous positive airway pressure (CPAP) device. The CPAP device creates a continuous level of air pressure with an air pump. The air is delivered to your airway through a mask while you sleep. This continuous pressure keeps your airway open.  ?? Nasal expiratory positive airway  pressure (EPAP) device. The EPAP device creates positive air pressure as you exhale. The device consists of single-use valves, which are inserted into each nostril and held in place by adhesive. The valves create very little resistance when you inhale but create much more resistance when you exhale. That increased resistance creates the positive airway pressure. This positive pressure while you exhale keeps your airway open, making it easier to breath when you inhale again.  ?? Bilevel positive airway pressure (BPAP) device. The BPAP device is used mainly in patients with central sleep apnea. This device is similar to the CPAP device because it also uses an air pump to deliver continuous air pressure through a mask. However, with the BPAP machine, the pressure is set at two different levels. The pressure when you exhale is lower than the pressure when you inhale.  ?? Surgery. Typically, surgery is only done if you cannot comply with less invasive treatments or if the less invasive treatments do not improve your condition. Surgery involves removing excess tissue in your airway to create a wider passage way.  Document Released: 03/25/2002 Document Revised: 10/04/2011 Document Reviewed: 08/11/2011  ExitCare?? Patient Information ??2014 [employer], [place].      PREPARING TO USE POSITIVE AIRWAY PRESSURE DEVICE AT HOME    Setting up your equipment (To be used at all times when sleeping, including naps)  ?? Place your machine on a hard, level surface close to where you sleep. Plug your machine into a grounded outlet. If you need to use an extension cord, we suggest you use a surge protector.   ?? Connect your hose to the airflow outlet and connect the other end of the hose to your mask.   ?? Using distilled water only, fill the water chamber. Do NOT fill above the maximum line.   ?? Replace the distilled water daily, as bacteria may start to grow.   ?? If you have nasal dryness or a dry mouth, increase the humidity level one increment  at a time.  ?? If your sleeping environment is cool, you can place the PAP hose under your covers to prevent condensation in the tubing.   ?? INSTRUCTIONS ON HOW TO PUT ON YOUR MASK: Place your mask on your face loosely. Most patients make the mistake of putting their masks on too tight.    o Turn on your machine. Lay back on your pillow to adjust your mask, as it will probably fit differently when you sit up vs. lying down. Remember, start very loose, and tighten as needed. A loose mask is much better than a tight mask. You will feel some air from the exhalation port on the mask. This is normal.   ?? Please note that if you are given a mask during your overnight sleep study or by your DME (medical supply company) you have 30 days to make any changes or exchanges. After the 30-day period, you may not be able to return the  mask or make changes for up to 3 months or longer depending on your insurance. If you have any questions please contact your DME (medical supply company).      We hope you do well adjusting to using your PAP device. PAP therapy is the gold standard treatment for Obstructive Sleep Apnea.     Few features we would like to highlight on your PAP device.       The first thing we want to point out is the RAMP feature. If you need temporary relief from the full therapy pressure of your machine, you can press the ramp button.  Respironics device: Press the elongated triangle button once.   F&P device: Press and hold the large dial for 3 seconds.    ResMed device: Ramp feature will automatically activate when you place the mask on your face (this is a built in feature). To reactivate it just pull the mask away from you face and reposition it. Ramp feature will reactivate.     The Ramp feature temporarily allows you to reduce the pressure on your machine. Your machine can be set to ramp up within 5-45 minutes to your full therapy pressure. If you are not asleep by the time your machine reaches full pressure,  and you need relief, you can re-start the ramping process by pressing the button again. You can use the ramp feature as many times and as often as you like. This is a useful feature if you use the restroom in the middle of the night.     The next thing we would like to point out is the HUMIDIFICATION feature on your PAP device. Most machines are pre-set for a humidity setting of 3 or 4. You can manually change the humidifier settings on your machine when you turn the machine on. Increase the humidity level one increment at a time.  Respironics device: Turn the on/off button to the left for less humidification or the right for more humidification. Humidification ranges from 1-5. If you have a heated hose talk to your provider or DME who can teach you how to make adjustments.   F&P device: Turn the large dial to the right. Humidification ranges from 1-7.   ResMed device: Attach the humidifier component to the main unit, turn   the dial and choose the water droplet icon; press the button once. Then turn the dial to adjust your humidity setting. Press button a final time to save the setting. Humidification ranges from 1-8 (depending on device model). The newer device has an Auto humidification feature, if you have a heated hose talk to your provider or DME to further assistance.     For example, if you are waking up with a dry mouth, you should turn your humidifier up one number at a time. If you are getting excess condensation in your mask or tubing, you should turn the humidifier down one number at a time.      A prescription will be sent to a DME (medical supply company) covered by your insurance. The DME Company should contact you within 7-10 business days once they receive the prescription. If you have not received a call from the DME Company regarding your device, PLEASE CALL us so we may investigate further.     Once set up with your PAP equipment, please call our office and set up an appointment with your sleep  provider. At that visit, we will obtain a download from your machine, address any issues with  the mask or device, review cleaning instructions, replacement supply schedule and answer any questions you may have.     After receiving your PAP device please remember you need to bring all your equipment to EVERY office visit including your machine, tubing, mask and power cord. Make sure you do not travel with water in the humidifier (always empty it every morning). A thorough check of all your equipment will take place at each appointment.     CLEANING INSTRUCTIONS FOR PAP EQUIPMENT     Keeping your equipment and supplies clean is very important.     REMINDER: Only use DISTILLED WATER in your humidifier, Empty water daily.    DAILY  Mask and tubing:   ?? Wash your face before applying mask  ?? Wash mask and tubing with baby shampoo and warm water.     Humidifier:   ?? Empty water in reservoir  ?? Clean with baby shampoo and warm water  ?? Rinse, then air dry    WEEKLY  Mask and tubing:   ?? Soak your mask and tubing in 1 part vinegar and 3 parts water for 30 minutes. Rinse, and allow to air dry.     Humidifier:   ?? Wash with warm water and baby shampoo  ?? Soak in 1 part vinegar and 3 parts water for 30 minutes  ?? Rinse with warm water and allow to air dry    Machine Exterior:   ?? Wipe with a clean damp cloth    MONTHLY AND/OR AS NEEDED  ?? Reusable foam filters (black filter)- wash in warm water with baby shampoo. Rinse well and dry with paper towel  ?? Disposable felt filter (white filter)- Replace filter every two weeks to once a month    NOTE: If you are having repeated sinus and /or respiratory infections, dirty equipment may be the cause. It may help to clean and disinfect your equipment more frequently    TRAVELING  Always make sure the humidifier is empty when traveling. (including doctor appointments, air travel or long distance driving).  When flying, always carry your PAP device with you as a carry on item, NEVER check  it in as baggage. We can provide you with a letter stating it is a medical device, talk to your provider.    Always make sure you have your mask and tubing with you. You will need appropriate plug adapters when traveling outside the [place].    If travelling by air and you are unable to carry distilled water with youuse bottled water (no more than 2 weeks). DO NOT USE TAP WATER.     Equipment Replacement Schedule    To get the most benefit from your PAP therapy, your equipment should be replaced when necessary based on wear and tear. For example, your mask may need to be replaced if you notice it is cracked or the seal is leaking. If your tubing is torn, it needs to be replaced.   If you equipment is showing signs of wear, you may be entitled to replace it. The replacement schedule for Medicare patients is shown below. If you are NOT a Medicare patient, please check with your DME (Durable Medical Supply) provider for your individual insurance policy???s replacement schedule.     Supplies   Medicare Medicaid My Insurance Plan  Mask    1 per 3 m 1 per year _______________  Nasal cushion   2 per m 1 per 6 m _______________  Pillows cushion  2 per m 1per 6 m _______________  Full-face cushion  1 per m 1per 6 m _______________  Headgear   1 per 6 m 1 per year _______________  Water Chamber  1 per 6 m N/A  _______________  Priscille Heidelberg   1 per 6 m 1 per 6 m       _______________  Tubing/Hose   1 per 3 m 1 per year _______________  Heater wire tubing  1 per 3 m N/A  _______________  Filter, disposable (white) 2 per m 1 per m _______________  Filter, particle foam (black) 1 per 6 m 4 per year _______________  Therapy device  1 per 5 yrs  ---  _______________    Follow up and compliance goals for Medicare and Medicaid patients   (most private insurances are now following similar rules)  Medicare and Medicaid require patients to follow-up with their provider after they are set up with their PAP equipment. The follow up  appointment should be within 31-90 days after receiving their equipment. If patient does not follow up as directed there might be issues with the cost of the device being covered by the insurance. Therefore please ensure you make a follow up appointment with your sleep provider.      Insurance also requires that the patient meet some compliance goals such as the download should indicate patient has been wearing the device for at least 4 hours per night AND they are using the device for 30 nights in a row.     Key points to remember  ?? You should wear the device every time you sleep, no matter when you sleep or where you sleep. When you nap or travel or go on vacation you need to use the machine when asleep.   ?? If you sleep without the device your Obstructive Sleep Apnea (OSA) is untreated and your risk of heart attacks and strokes are higher.   ?? Clean the device on a regularly basis to avoid sinus infections and pneumonias   ?? Never travel with water in the reservoir   ?? Replace supplies on a regularly basis (see schedule below). You may need to contact your Durable Medical Equipment company (DME) and request supplies (just like you would medications from a pharmacy).  ?? Alcohol will worsen OSA, Alcohol can decrease your drive to breathe, slowing your breathing and making your breaths shallow. In addition, it may relax the muscles of your throat, which may make it more likely for your upper airway to collapse. REMEMBER TO ALWAYS WEAR YOUR PAP DEVICE WHEN SLEEPING.   ?? Certain medications can also worsen OSA such as some pain medications, anti-anxiety medications, muscle relaxants, testosterone supplementation. Please inform your sleep provider which medications you take on a regular basis.   ?? All patients are advised to make follow up appointments with the provider after they are set up with their PAP device. Most insurance plans now require the patient set up a follow up appointment with the provider 31-90 days  after they have been set up with their device and at least once a year thereafter. Discuss these issues with your provider for more details.   ?? Please note when you are provided with a mask you have a manufacturer 30 day guarantee that comes with it. If you do not like that mask for any reason contact your DME and request an exchange. After 30 days you may not be eligible for a new mask for another 3-6 months depending  on insurance. Talk to your DME or sleep provider for more details.   ?? If you are struggling with nasal congestion try to use a nasal sinus rinse available over the counter such as Netti Pot 1 hour before bedtime (you may have to use it daily for a 7-10 days then as needed). You can also try over the counter Flonase or Nasacort 1-2 sprays in each nostril at bedtime.   ?? If you ever have problems with the device or the plan discussed with your sleep provider has not come to fruition please contact your provider by phone or via MyChart. Thank you.    Drowsy Driving Tips  These suggestions will help prevent you from the risk of drowsy driving.     1. If you feel tired or drowsy don't drive. Sleepiness is a major cause of motor vehicle accidents and accounts for 40% of all fatal crashes reported on the [facility]. No matter how much you think you can control sleepiness, you can't.   2. Ensure you follow your doctor???s advice about the treatment for your sleep disorder. For example, if you have sleep apnea and use CPAP, ensure you use it fully the night before your trip.   3. Get a good night's sleep before driving. Do not cut yourself short of sleep if you plan a long drive the next day. Get to bed early and do not stay up late packing.   4. Avoid alcohol both the night before your trip and during your trip. Alcohol will disrupt sleep and make you more tired the next day. Sleepiness and alcohol are additive in increasing impairment of your driving ability.   5. Avoid any sedative medications, including  sedative antihistamines that are often contained in cold or allergy medications, the night before you drive as they may have long lasting effects the next day.  6. Travel during non-sleeping hours. Accidents due to sleepiness are more common during the nighttime hours.   7. If sleepy, stop and rest. Drink coffee, walk around or have a brief nap in your car if you are sleepy. Have a 10 -15 minute break after every 2 hours of driving.   8. Drive with a companion. Share the driving. Relax in the back seat until it is your time to share the driving again.

## 2018-02-06 NOTE — Telephone Encounter (Signed)
Patient was at appointment

## 2018-03-05 NOTE — Unmapped (Signed)
Pt asked to speak to nurse regarding c-pap machine.Pt asked what DME company prescription submitted to?Pt also asked why referral was sent to [facility] for headaches when he only have headaches after hitting his head after night terrors.Pt asked to further discuss and can be reached at [phone]

## 2018-03-06 NOTE — Telephone Encounter (Signed)
[facility] advising [facility] is d[facility]. Also advised referral to headache due to patient concerns of worsening migraines and concern of stroke.  Gave my direct phone number for any other calls.

## 2018-05-28 ENCOUNTER — Encounter: Payer: Medicare (Managed Care) | Attending: Neurology

## 2018-05-29 ENCOUNTER — Ambulatory Visit: Payer: Medicare (Managed Care) | Attending: Neurology

## 2022-06-08 IMAGING — DX KNEE 3 VIEWS LEFT
1 series · 3 of 3 positions shown · non-contrast
Comparison: Encountered for other specified special examinations.

________________________________________________________________________________________________ 
KNEE 3 VIEWS LEFT, 06/08/2022 [DATE]: 
CLINICAL INDICATION: Encounter For Other Specified Special Examinations.

[Series 1: AP · 0.14mm/px · 3 of 3 slices shown]
[im 1/3]
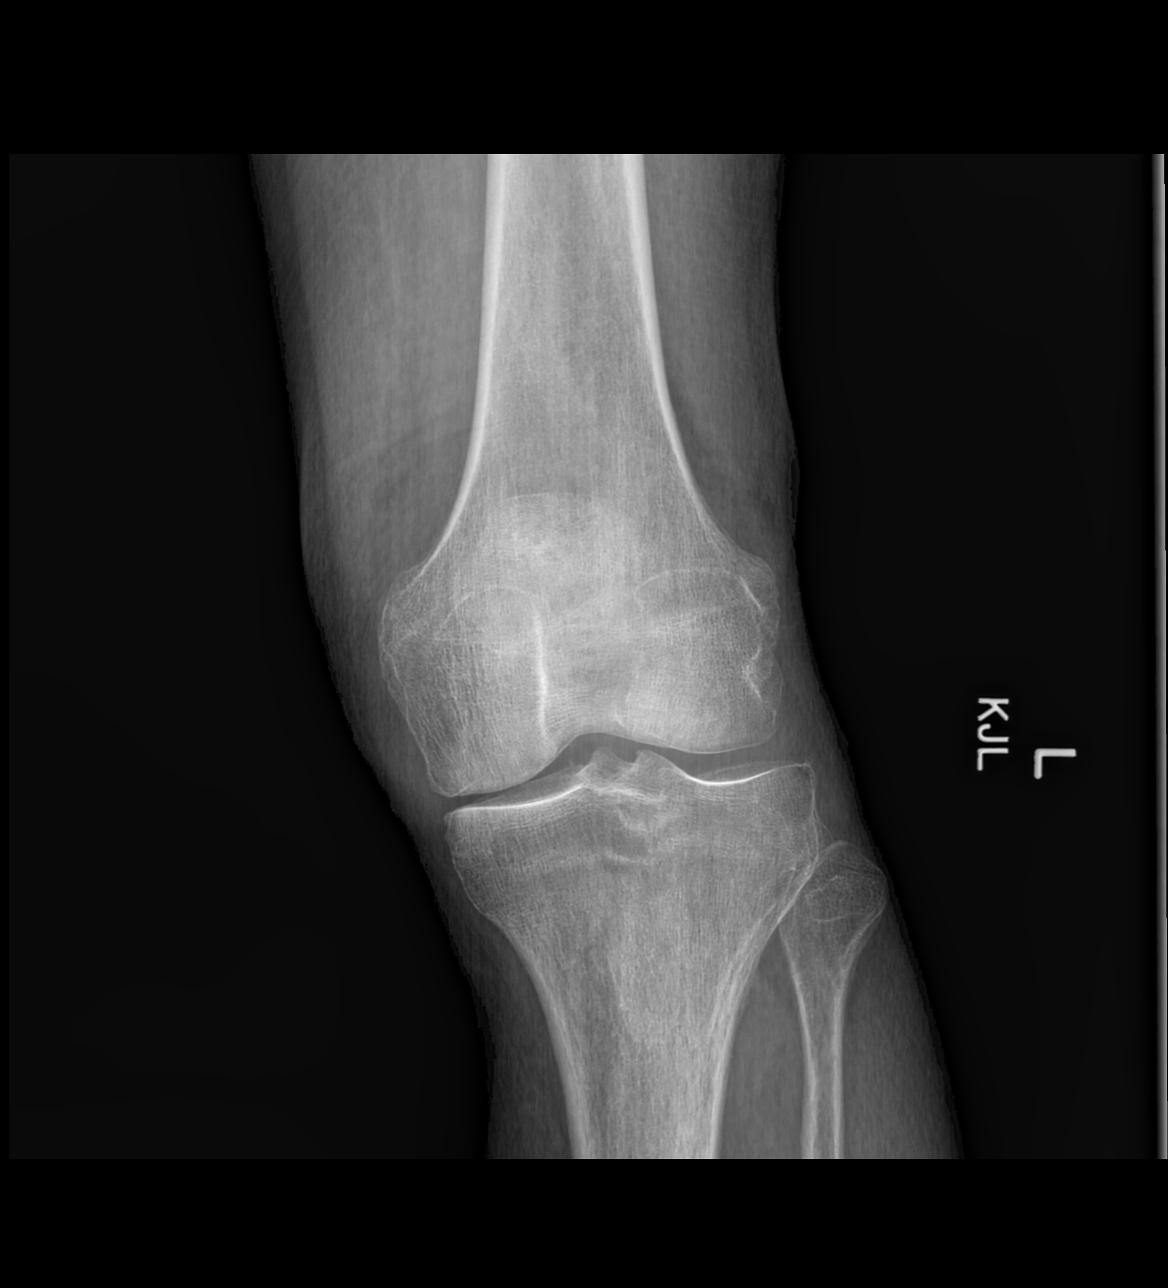
[im 2/3]
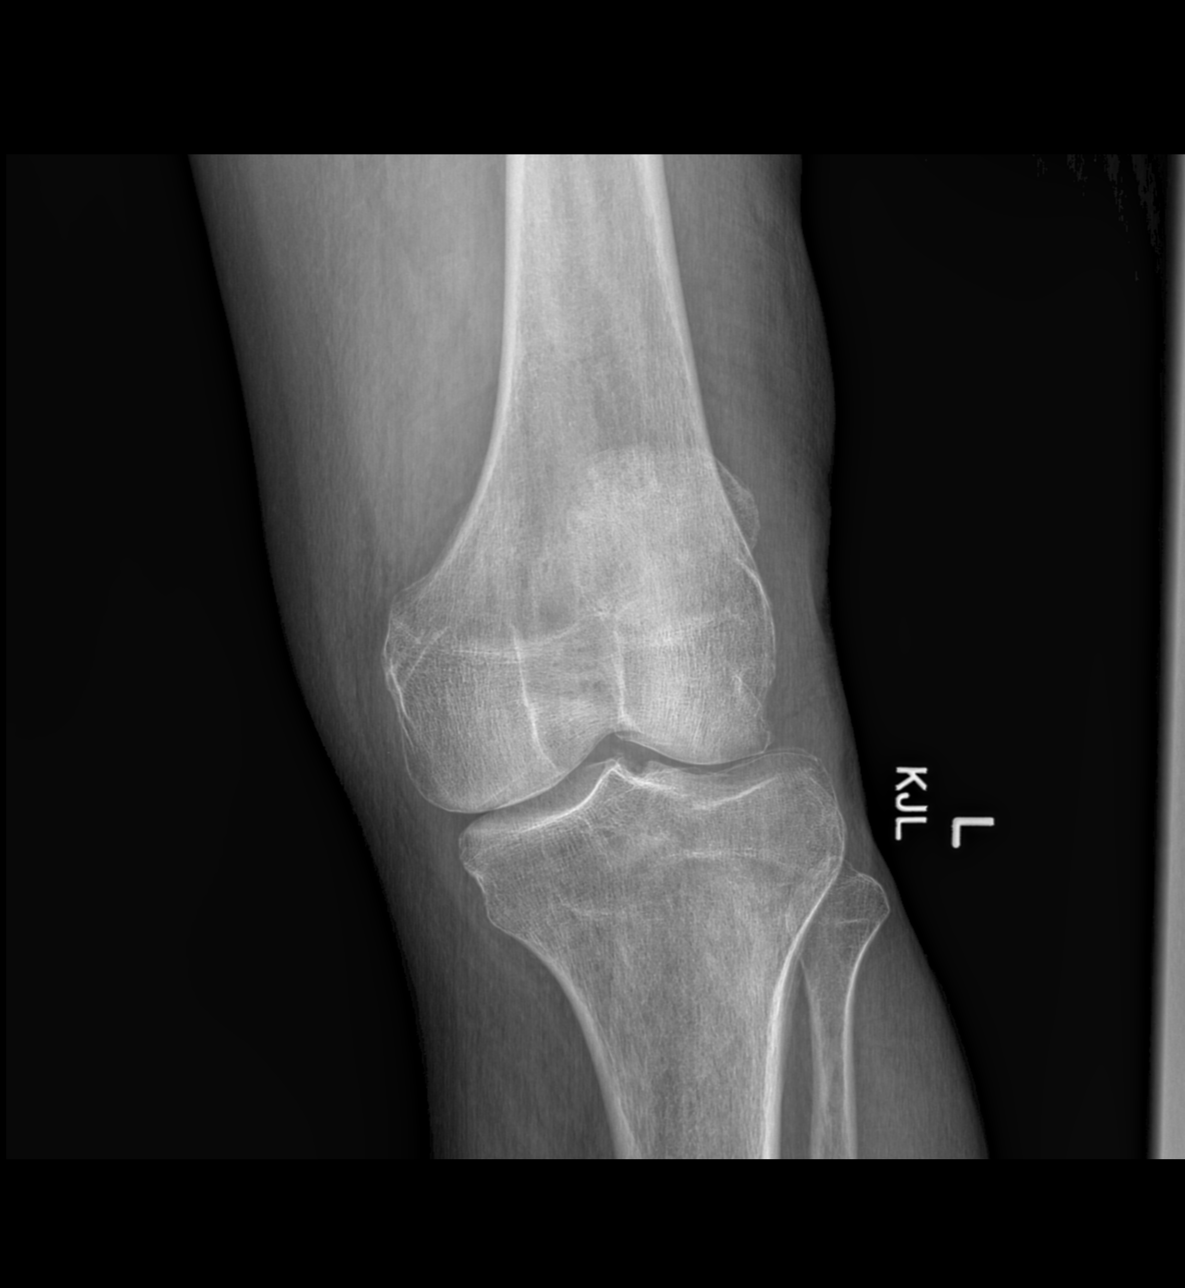
[im 3/3]
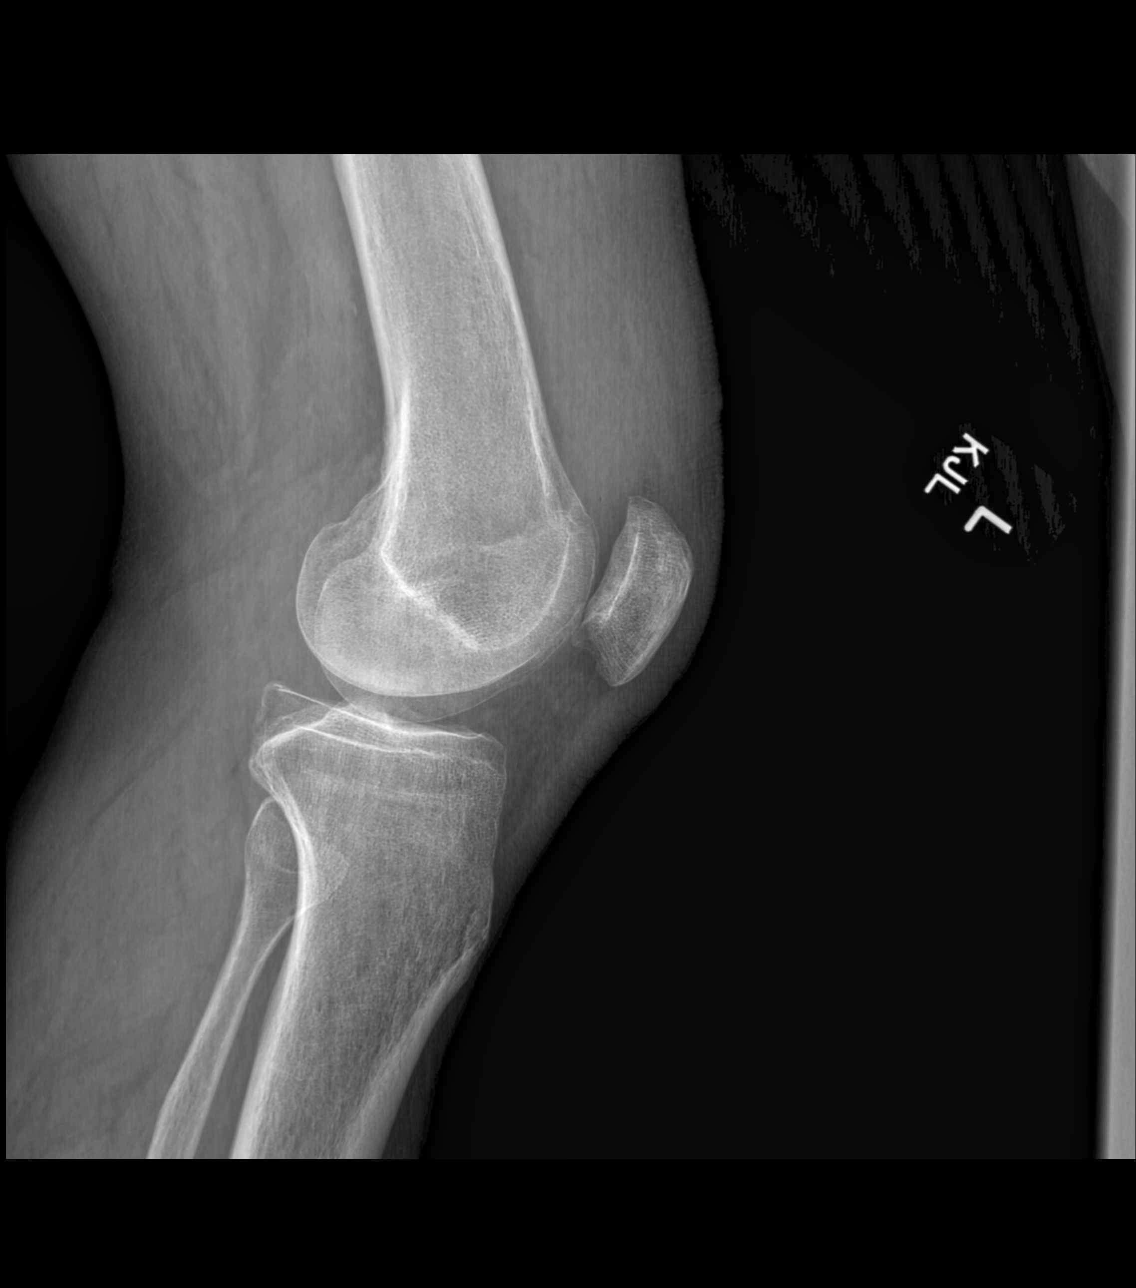

[3 of 3 positions shown; findings below may reference images not displayed]

FINDINGS: No fracture. Normal alignment. Mild medial compartment joint space 
narrowing. Large joint effusion. Osteopenia.
IMPRESSION: Mild degenerative change, large joint effusion and osteopenia: DXA with TBS 
(trabecular bone score) may be helpful for further evaluation.

## 2022-09-30 IMAGING — DX SHOULDER LEFT 2 VIEWS
1 series · 2 of 2 positions shown · non-contrast
Comparison: None.

________________________________________________________________________________________________ 
SHOULDER LEFT 2 VIEWS, 09/30/2022 [DATE]: 
CLINICAL INDICATION: Shoulder pain. Patient fell in February 2022.

[Series 1: AP · 0.14mm/px · 2 of 2 slices shown]
[im 1/2]
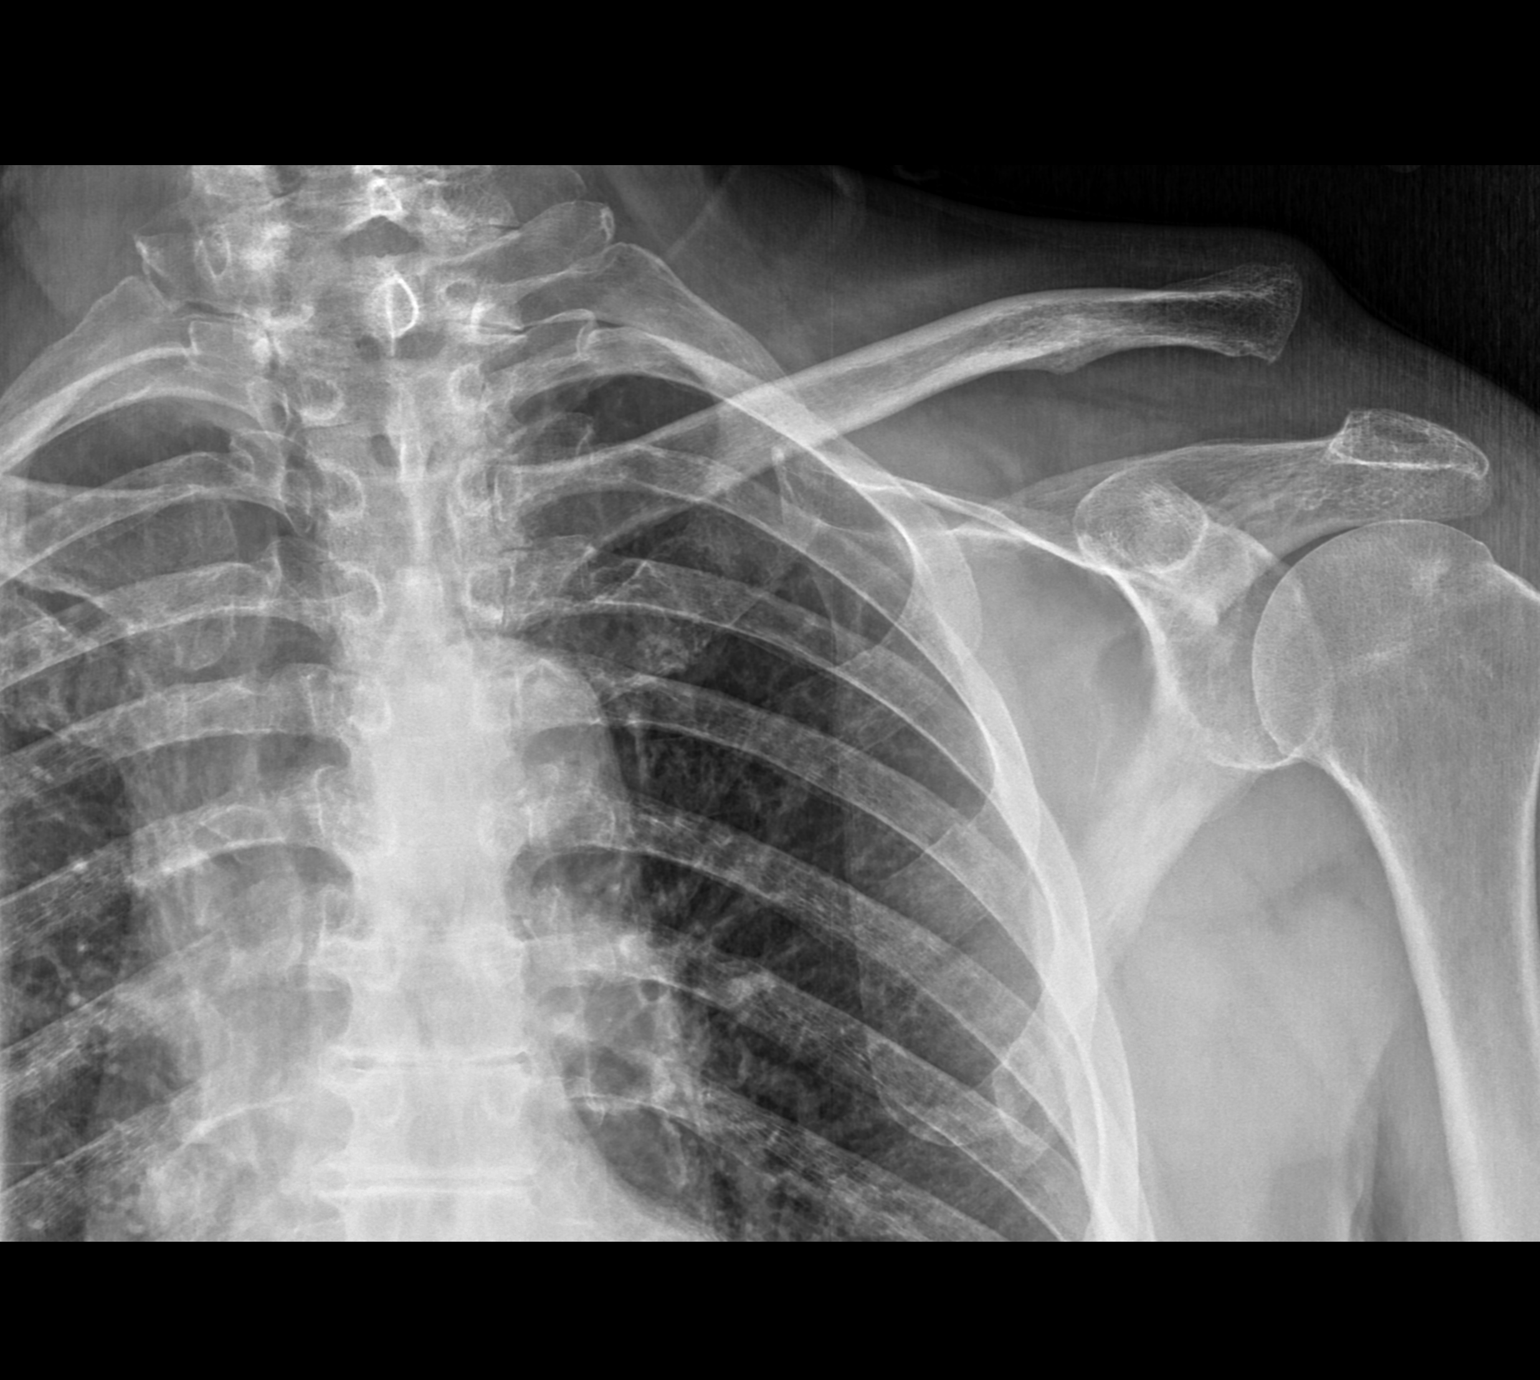
[im 2/2]
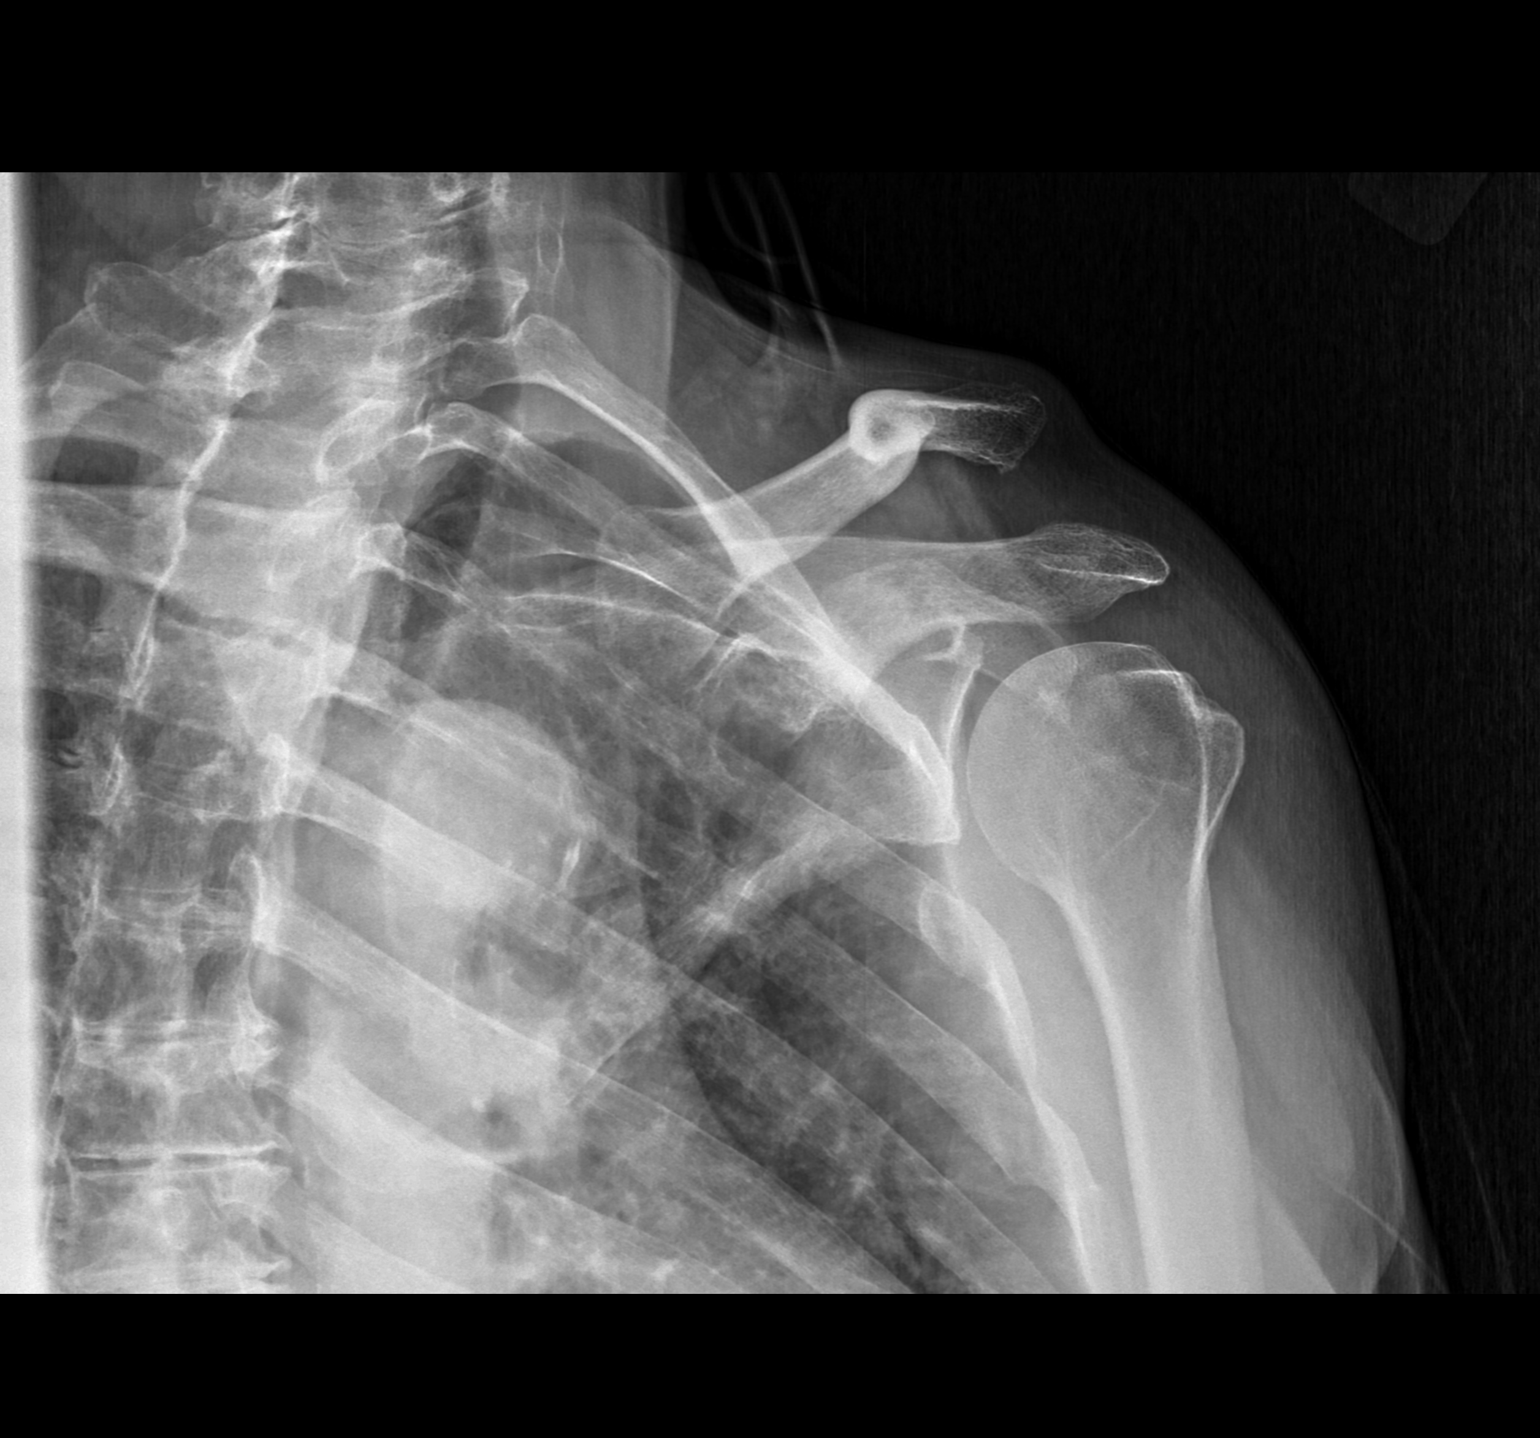

[2 of 2 positions shown; findings below may reference images not displayed]

FINDINGS: Nonacute grade 3 AC joint separation with AC joint measuring 2.1 cm in 
width and full shaft superior displacement of the clavicle relative to the 
acromion. Coracoacromial interval measures 2.4 cm. No fracture. Glenohumeral 
joint is preserved. Degenerative change of the spine. Normal bone density. 
Atherosclerosis.
IMPRESSION: Nonacute grade 3 AC joint separation.
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
